# Patient Record
Sex: Male | Born: 1955 | Race: White | Hispanic: No | Marital: Married | Smoking: Never smoker
Health system: Southern US, Community
[De-identification: ages and names within clinical notes are randomized; demographics above are authoritative.]

## PROBLEM LIST (undated history)

## (undated) DIAGNOSIS — I10 Essential (primary) hypertension: Secondary | ICD-10-CM

## (undated) DIAGNOSIS — K219 Gastro-esophageal reflux disease without esophagitis: Secondary | ICD-10-CM

## (undated) DIAGNOSIS — E785 Hyperlipidemia, unspecified: Secondary | ICD-10-CM

## (undated) DIAGNOSIS — I6529 Occlusion and stenosis of unspecified carotid artery: Secondary | ICD-10-CM

## (undated) DIAGNOSIS — I2510 Atherosclerotic heart disease of native coronary artery without angina pectoris: Secondary | ICD-10-CM

## (undated) DIAGNOSIS — IMO0002 Reserved for concepts with insufficient information to code with codable children: Secondary | ICD-10-CM

## (undated) HISTORY — DX: Occlusion and stenosis of unspecified carotid artery: I65.29

## (undated) HISTORY — PX: CERVICAL SPINE SURGERY: SHX589

## (undated) HISTORY — PX: CHOLECYSTECTOMY: SHX55

## (undated) HISTORY — PX: CARDIAC CATHETERIZATION: SHX172

## (undated) HISTORY — DX: Hyperlipidemia, unspecified: E78.5

## (undated) HISTORY — PX: CORONARY ANGIOPLASTY WITH STENT PLACEMENT: SHX49

---

## 2012-04-20 ENCOUNTER — Encounter (HOSPITAL_COMMUNITY)
Admission: AD | Disposition: A | Discharge status: Home / Self Care | Payer: Self-pay | Source: Other Acute Inpatient Hospital | Attending: Cardiology

## 2012-04-20 ENCOUNTER — Encounter (HOSPITAL_COMMUNITY): Payer: Self-pay | Admitting: General Practice

## 2012-04-20 ENCOUNTER — Inpatient Hospital Stay (HOSPITAL_COMMUNITY)
Admission: AD | Admit: 2012-04-20 | Discharge: 2012-04-22 | DRG: 287 | Disposition: A | Discharge status: Home / Self Care | Payer: Managed Care, Other (non HMO) | Source: Other Acute Inpatient Hospital | Attending: Cardiology | Admitting: Cardiology

## 2012-04-20 DIAGNOSIS — Z7902 Long term (current) use of antithrombotics/antiplatelets: Secondary | ICD-10-CM

## 2012-04-20 DIAGNOSIS — R072 Precordial pain: Secondary | ICD-10-CM

## 2012-04-20 DIAGNOSIS — I2510 Atherosclerotic heart disease of native coronary artery without angina pectoris: Secondary | ICD-10-CM

## 2012-04-20 DIAGNOSIS — R1110 Vomiting, unspecified: Secondary | ICD-10-CM | POA: Diagnosis not present

## 2012-04-20 DIAGNOSIS — R51 Headache: Secondary | ICD-10-CM | POA: Diagnosis not present

## 2012-04-20 DIAGNOSIS — R079 Chest pain, unspecified: Secondary | ICD-10-CM

## 2012-04-20 DIAGNOSIS — R0789 Other chest pain: Principal | ICD-10-CM | POA: Diagnosis present

## 2012-04-20 DIAGNOSIS — Z7982 Long term (current) use of aspirin: Secondary | ICD-10-CM

## 2012-04-20 DIAGNOSIS — Z9861 Coronary angioplasty status: Secondary | ICD-10-CM

## 2012-04-20 DIAGNOSIS — I10 Essential (primary) hypertension: Secondary | ICD-10-CM | POA: Diagnosis present

## 2012-04-20 DIAGNOSIS — K219 Gastro-esophageal reflux disease without esophagitis: Secondary | ICD-10-CM | POA: Diagnosis present

## 2012-04-20 HISTORY — DX: Gastro-esophageal reflux disease without esophagitis: K21.9

## 2012-04-20 HISTORY — DX: Atherosclerotic heart disease of native coronary artery without angina pectoris: I25.10

## 2012-04-20 HISTORY — PX: LEFT HEART CATHETERIZATION WITH CORONARY ANGIOGRAM: SHX5451

## 2012-04-20 HISTORY — DX: Essential (primary) hypertension: I10

## 2012-04-20 HISTORY — DX: Reserved for concepts with insufficient information to code with codable children: IMO0002

## 2012-04-20 LAB — CARDIAC PANEL(CRET KIN+CKTOT+MB+TROPI)
CK, MB: 4.5 ng/mL — ABNORMAL HIGH (ref 0.3–4.0)
Relative Index: INVALID (ref 0.0–2.5)
Troponin I: <0.3 ng/mL (ref <0.30)

## 2012-04-20 SURGERY — LEFT HEART CATHETERIZATION WITH CORONARY ANGIOGRAM
Anesthesia: LOCAL

## 2012-04-20 MED ORDER — PANTOPRAZOLE SODIUM 40 MG PO TBEC
40.0000 mg | DELAYED_RELEASE_TABLET | Freq: Every day | ORAL | Status: DC
  Administered 2012-04-20 – 2012-04-22 (×3): 40 mg via ORAL
  Filled 2012-04-20 (×3): qty 1

## 2012-04-20 MED ORDER — ISOSORBIDE MONONITRATE ER 30 MG PO TB24
30.0000 mg | ORAL_TABLET | Freq: Every day | ORAL | Status: DC
  Administered 2012-04-21 – 2012-04-22 (×2): 30 mg via ORAL
  Filled 2012-04-20 (×3): qty 1

## 2012-04-20 MED ORDER — HEPARIN (PORCINE) IN NACL 100-0.45 UNIT/ML-% IJ SOLN
1000.0000 [IU]/h | INTRAMUSCULAR | Status: DC
  Filled 2012-04-20: qty 250

## 2012-04-20 MED ORDER — METOPROLOL SUCCINATE ER 25 MG PO TB24
25.0000 mg | ORAL_TABLET | Freq: Every day | ORAL | Status: DC
  Filled 2012-04-20: qty 1

## 2012-04-20 MED ORDER — ATORVASTATIN CALCIUM 40 MG PO TABS
40.0000 mg | ORAL_TABLET | Freq: Every day | ORAL | Status: DC
  Administered 2012-04-20 – 2012-04-22 (×3): 40 mg via ORAL
  Filled 2012-04-20 (×4): qty 1

## 2012-04-20 MED ORDER — NITROGLYCERIN IN D5W 200-5 MCG/ML-% IV SOLN: 5.0000 ug/min | INTRAVENOUS | Status: DC

## 2012-04-20 MED ORDER — NITROGLYCERIN 0.2 MG/ML ON CALL CATH LAB
INTRAVENOUS | Status: AC
  Filled 2012-04-20: qty 1

## 2012-04-20 MED ORDER — LIDOCAINE HCL (PF) 1 % IJ SOLN
INTRAMUSCULAR | Status: AC
  Filled 2012-04-20: qty 30

## 2012-04-20 MED ORDER — CLOPIDOGREL BISULFATE 75 MG PO TABS
75.0000 mg | ORAL_TABLET | Freq: Every day | ORAL | Status: DC
  Administered 2012-04-21 – 2012-04-22 (×2): 75 mg via ORAL
  Filled 2012-04-20 (×2): qty 1

## 2012-04-20 MED ORDER — ASPIRIN EC 81 MG PO TBEC
81.0000 mg | DELAYED_RELEASE_TABLET | Freq: Every day | ORAL | Status: DC
  Administered 2012-04-21 – 2012-04-22 (×2): 81 mg via ORAL
  Filled 2012-04-20 (×4): qty 1

## 2012-04-20 MED ORDER — HEPARIN SODIUM (PORCINE) 1000 UNIT/ML IJ SOLN
INTRAMUSCULAR | Status: AC
  Filled 2012-04-20: qty 1

## 2012-04-20 MED ORDER — HEPARIN (PORCINE) IN NACL 2-0.9 UNIT/ML-% IJ SOLN
INTRAMUSCULAR | Status: AC
  Filled 2012-04-20: qty 2000

## 2012-04-20 MED ORDER — MORPHINE SULFATE 2 MG/ML IJ SOLN
INTRAMUSCULAR | Status: AC
  Filled 2012-04-20: qty 1

## 2012-04-20 MED ORDER — MORPHINE SULFATE 2 MG/ML IJ SOLN
2.0000 mg | INTRAMUSCULAR | Status: AC
  Administered 2012-04-20: 2 mg via INTRAVENOUS

## 2012-04-20 MED ORDER — SODIUM CHLORIDE 0.9 % IV SOLN
INTRAVENOUS | Status: DC
  Administered 2012-04-20: 15:00:00 via INTRAVENOUS

## 2012-04-20 MED ORDER — VERAPAMIL HCL 2.5 MG/ML IV SOLN
INTRAVENOUS | Status: AC
  Filled 2012-04-20: qty 2

## 2012-04-20 MED ORDER — MORPHINE SULFATE 2 MG/ML IJ SOLN
2.0000 mg | INTRAMUSCULAR | Status: DC | PRN
  Administered 2012-04-21 – 2012-04-22 (×3): 2 mg via INTRAVENOUS
  Filled 2012-04-20 (×3): qty 1

## 2012-04-20 MED ORDER — BIVALIRUDIN 250 MG IV SOLR
INTRAVENOUS | Status: AC
  Filled 2012-04-20: qty 250

## 2012-04-20 MED ORDER — SODIUM CHLORIDE 0.9 % IV SOLN
INTRAVENOUS | Status: AC
  Administered 2012-04-20: 20:00:00 via INTRAVENOUS

## 2012-04-20 MED ORDER — ADENOSINE 12 MG/4ML IV SOLN
16.0000 mL | Freq: Once | INTRAVENOUS | Status: DC
  Filled 2012-04-20: qty 16

## 2012-04-20 MED ORDER — ACETAMINOPHEN 325 MG PO TABS
650.0000 mg | ORAL_TABLET | ORAL | Status: DC | PRN
  Administered 2012-04-21: 650 mg via ORAL
  Filled 2012-04-20: qty 2

## 2012-04-20 MED ORDER — LISINOPRIL 40 MG PO TABS
40.0000 mg | ORAL_TABLET | Freq: Every day | ORAL | Status: DC
  Filled 2012-04-20: qty 1

## 2012-04-20 MED ORDER — ONDANSETRON HCL 4 MG/2ML IJ SOLN
4.0000 mg | Freq: Four times a day (QID) | INTRAMUSCULAR | Status: DC | PRN
  Administered 2012-04-21: 4 mg via INTRAVENOUS
  Filled 2012-04-20: qty 2

## 2012-04-20 MED ORDER — CLOPIDOGREL BISULFATE 75 MG PO TABS: 75.0000 mg | ORAL_TABLET | Freq: Every day | ORAL | Status: DC

## 2012-04-20 MED ORDER — ASPIRIN EC 81 MG PO TBEC
81.0000 mg | DELAYED_RELEASE_TABLET | Freq: Every day | ORAL | Status: DC
  Filled 2012-04-20: qty 1

## 2012-04-20 MED ORDER — NITROGLYCERIN 0.4 MG SL SUBL
0.4000 mg | SUBLINGUAL_TABLET | SUBLINGUAL | Status: DC | PRN
  Administered 2012-04-21: 0.4 mg via SUBLINGUAL
  Filled 2012-04-20: qty 25

## 2012-04-20 MED FILL — Nitroglycerin IV Soln 200 MCG/ML in D5W: INTRAVENOUS | Qty: 250 | Status: AC

## 2012-04-20 MED FILL — Heparin Sodium (Porcine) 100 Unt/ML in Sodium Chloride 0.45%: INTRAMUSCULAR | Qty: 250 | Status: AC

## 2012-04-20 NOTE — CV Procedure (Addendum)
Cardiac Catheterization Operative Report  SINCLAIR ALLIGOOD [id number] 8/26/20134:26 PM Donnetta Hutching DAVID, MD  Procedure Performed:  1. Left Heart Catheterization 2. Selective Coronary Angiography 3. Left ventricular angiogram 4. Fractional flow reserve mid LAD  Operator: Verne Carrow, MD  Arterial access site:  Right radial artery.   Indication:  Chest pain in pt with known CAD with previous proximal to mid LAD stent. EKG without ischemic changes.  CK and troponin normal in setting of ongoing chest pain for the last 8 hours. Pt reportedly had a normal stress test several weeks ago.                                 Procedure Details: The risks, benefits, complications, treatment options, and expected outcomes were discussed with the patient. The patient and/or family concurred with the proposed plan, giving informed consent. The patient was brought to the cath lab after IV hydration was begun and oral premedication was given. The patient was further sedated with Versed and Fentanyl. The right wrist was assessed with an Allens test which was positive. The right wrist was prepped and draped in a sterile fashion. 1% lidocaine was used for local anesthesia. Using the modified Seldinger access technique, a 5 French sheath was placed in the right radial artery. 3 mg Verapamil was given through the sheath. 3000 units IV heparin was given. He had been on a heparin drip on the floor. Standard diagnostic catheters were used to perform selective coronary angiography. A pigtail catheter was used to perform a left ventricular angiogram. He was found to have a moderate stenosis in the mid LAD beyond the stented segment. This appeared to represent 50-60% stenosis angiographically. I elected to perform a Fractional Flow Reserve measurement with a flow wire. The sheath was upsized to a 6 [place] system. I then gave the pt a bolus of Angiomax and a drip was started. I engaged the left main with a XB  LAD 3.5 guiding catheter. When the ACT was greater than 200, I passed a pressure wire down the LAD, normalizing in the left main segment. Baseline FFR was 0.93 beyond the stenosis in the mid vessel. IV adenosine was given and after 2 minutes of infusion at peak hyperemia, the FFR was 0.84. The stenosis was not flow limiting based on this data. Final angiography demonstrated no disruption of the LAD after pressure wire analysis. The guide and wire were removed.   The sheath was removed from the right radial artery and a Terumo hemostasis band was applied at the arteriotomy site on the right wrist.  There were no immediate complications. The patient was taken to the recovery area in stable condition.   Hemodynamic Findings: Central aortic pressure: 117/76 Left ventricular pressure: 119/3/10  Angiographic Findings:  Left main: No obstructive disease noted.     Left Anterior Descending Artery: Moderate sized vessel that does not reach the apex. The mid vessel has a patent stent just beyond the trifurcation of a moderate sized diagonal branch and a moderate sized septal perforator. Just beyond the stent in the mid LAD, there is a 50-60% stenosis. The distal LAD has no obstructive disease. The diagonal is moderate sized and has mild plaque disease.   Circumflex Artery: Large caliber vessel with mild plaque in the proximal and distal vessel. The first obtuse marginal branch is moderate sized and appears to have a 20-30% stenosis at the ostium.   Right Coronary  Artery: Large, dominant vessel with diffuse luminal irregularities in the proximal, mod and distal segments. There appears to be a mild plaque at the ostium of the vessel but there was no dampening of pressure with catheter engagement and reflux of contrast with injections. The PDA and PLA are moderate sized vessels with luminal irregularities but no obstructive disease.   Aortic root angiogram: No evidence of aneurysm or dissection.   Left  Ventricular Angiogram: LVEF=70%.   Impression: 1. Single vessel CAD with patent stent in mid LAD and moderate disease beyond the stented segment. FFR in the mid LAD is 0.84, suggesting the stenosis is not flow limiting.  2. Mild non-obstructive disease in the Circumflex and RCA 3. Normal LV systolic function 4. No evidence of aortic dissection 5. Non-cardiac chest pain  Recommendations: Medical management. Will look for other causes of chest pain. D-dimer negative at [facility] so chance of PE is small.        Complications:  None. The patient tolerated the procedure well.

## 2012-04-20 NOTE — Progress Notes (Addendum)
ANTICOAGULATION CONSULT NOTE - Initial Consult  Pharmacy Consult for heparin Indication: chest pain/ACS and r/o PE  No Known Allergies  Patient Measurements: Height: 6' (182.9 cm) Weight: 189 lb (85.73 kg) IBW/kg (Calculated) : 77.6  Heparin Dosing Weight: 85.7 kg  Vital Signs: Temp: 97.4 F (36.3 C) (08/26 1230) Temp src: Oral (08/26 1230) BP: 106/54 mmHg (08/26 1245) Pulse Rate: 59  (08/26 1245)  Labs: No results found for this basename: HGB:2,HCT:3,PLT:3,APTT:3,LABPROT:3,INR:3,HEPARINUNFRC:3,CREATININE:3,CKTOTAL:3,CKMB:3,TROPONINI:3 in the last 72 hours  CrCl is unknown because no creatinine reading has been taken.   Medical History: No past medical history on file.  Medications:  Scheduled:    . aspirin EC  81 mg Oral Daily  . clopidogrel  75 mg Oral Q breakfast  . DISCONTD: aspirin EC  81 mg Oral Daily  . DISCONTD: clopidogrel  75 mg Oral Q breakfast   Infusions:    . sodium chloride    . nitroGLYCERIN      Assessment: 56 yo male with ACS and r/o PE was transferred from [facility]  While there, received 5000 units iv bolus of heparin and started on heparin drip at 1000 units/hr since ~ 1015.  At [facility], H/H was 15.3/46.2 and Plt 185.  Probably will go to cath at ~1700 today.  Goal of Therapy:  Heparin level 0.3-0.7 units/ml Monitor platelets by anticoagulation protocol: Yes   Plan:  1) Since patient has been on the heparin drip at this rate since ~1015, will continue heparin drip at 1000 units/hr for now.  Obtain a heparin level at 1615 and reassess dosing or f/u after cath. 2) If continue heparin after cath, will have daily heparin level and CBC  Ashanna Heinsohn, Tsz-Yin 04/20/2012,2:34 PM

## 2012-04-20 NOTE — Progress Notes (Signed)
Patient received from [facility], called to write admission orders. I discussed the patient with Dr. Andee Lineman - please see H&P. Mr. Diodato was admitted with [place] and is for cath today. He was given ASA 324mg  at [facility] and also took his Plavix and metoprolol this morning. Unfortunately his wife no longer has his medication list and [facility] does not have a copy. We will ask our pharmacy team to see if they can clarify his home medications - have written order for them to page [place] when this is completed so we can appropriately order his home doses. For now, I have tentatively written for his ASA/Plavix to be continued tomorrow AM.   Patient had 6/10 pain on arrival to [facility] that was eased with NTG gtt. However, despite uptitration to 21mcg/min he had continued discomfort to 4/10. 12 lead EKG showed no acute changes. Patient examined, appeared restless in bed. He got 10mg  of IV morphine around 9-10am at [facility] that eased pain but did not help completely. He took 2 SL NTG prior to arrival in [place]. Remains on heparin gtt. Most recent BP 132/84 manually. 2mg  of morphine ordered. Cath lab team notified to take patient to lab given ongoing discomfort.  Dayna Dunn PA-C

## 2012-04-20 NOTE — H&P (Signed)
[facility]                                             [place], [place]  [address]                       NAME:  Ricky Hughes, Ricky Hughes            ROOM:                                      UNIT NUMBER:  [id number]                        LOCATION:      ER               ADM/VISIT DATE:     04/20/12                ADM PHYS:      Sinclair Ship MD         ACCT: [id number]                               DOB:           12-Apr-2056               CARDIOLOGY CONSULTATION REPORT                                                             DATE OF CONSULTATION:  04/20/2012               REASON FOR CONSULTATION:  Substernal chest pain.               HISTORY OF PRESENT ILLNESS:  The patient is a 56 year old male with a prior       history of coronary artery disease per his history.  He had prior cardiac       catheterizations in [place] and, apparently, several years ago had a stent       placed.  The patient is taking aspirin and Plavix on a regular basis.  I do not       have any further details on the location of his stent.               The patient is here with his friend and boss.  The patient this morning states       that when he woke up he felt very tired and had significant chest discomfort.        He rated his pain a 9/10.  He felt a tightness, which was radiating to the left       shoulder and left arm, with tingling and numbness in the  fingers.  He felt       clammy and diaphoretic.  He was not in the mood for breakfast, but did drink a       little bit of coffee.  He also became nauseated.  He felt quite lightheaded,       but had no presyncope or syncope.  Although he was nauseated, he did not report       any vomiting.  According to his boss, the patient has been experiencing       substernal chest pressure both at rest and on exertion over the last several       weeks.  This actually led to a stress test (GXT), which,  according to the       patient was within normal limits.  However, when I questioned him about it, he       states that he could not go very far on the treadmill and had significant chest       discomfort and felt very diaphoretic.               Next, he has been compliant with his medical therapy, and this includes his       aspirin and Plavix.  He also takes statin and beta blocker.               On arrival in the emergency room, he had ongoing chest pain, but there were no       acute EKG changes.  I did a stat bedside echocardiogram, which revealed       essentially normal LV function, but right ventricular enlargement.  I also had       a very good look at the aortic root and the ascending aorta, which were within       normal limits, with no evidence for dissection.               The patient received a total of 10 mg of morphine, which brought his pain down       to a 6/10, and he is also on intravenous nitroglycerin.  However, he is very       agitated and restless and continues to complaint of chest pressure.  Of note is       also that on the echocardiogram there was some mild inferior hypokinesis, but       it is hard to tell whether this is an old finding or a new finding.               We are unable to make the patient comfortable in the emergency room, and we       also started intravenous heparin.  He did report taking his aspirin and Plavix       this morning.  He was very hypertensive on admission, but this has come down       now.                CARDIOLOGY CONSULTATION - Medical Records' copy                    Page 1 of 4  [facility]                                             [place], [place]  [address]                       NAME:  Ricky Hughes, Ricky Hughes            ROOM:                                      UNIT NUMBER:  [id number]                        LOCATION:      ER               ADM/VISIT DATE:      04/20/12                ADM PHYS:      Sinclair Ship MD         ACCT: [id number]                               DOB:           March 26, 2056               CARDIOLOGY CONSULTATION REPORT                                       ALLERGIES:  No known drug allergies.  No IVP dye allergy.               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  He lives in [place], but declines to go to the [place] emergency       room.               FAMILY HISTORY:  Noncontributory.               PAST MEDICAL HISTORY:  Negative for diabetes mellitus.  Positive for       hypertension.  Patient does not really report any significant prior surgical       history.               SOCIAL HISTORY:  The patient does not smoke.  He lives with his wife.  He       denies any drug use.               REVIEW OF SYSTEMS:  The patient reports pain in the left lower extremity, with       cramping, but not consistent with claudication.  He also has intermittent left       neck pain, which seems to be associated with his chest pain.  He reports       nausea, but no vomiting.  He has had no fever or chills.  Remainder of the       18-point review of systems is as outlined above and otherwise negative.  PHYSICAL EXAMINATION:  Vital signs:  Blood pressure 168/104, but has come down       to 123 systolic after 2 nitroglycerins, intravenous nitroglycerin, and       morphine.  Heart rate is 73 bpm, respirations are 18, temperature is 97.3,       saturation is 100% on room air.  General:  Well-nourished white male, very       uncomfortable.  Shuffling back and forth on the stretcher.  HEENT:  Pupils       isocoric, EOMI.  Oropharynx is clear.  Normal dentition.  Neck:  Normal carotid       upstroke and no carotid bruits.  JVP is 6-7 cm.  No thyromegaly.  No nodular       thyroid.  Lungs:  Clear breath sounds bilaterally.  No wheezing.  Heart:        Regular rate and rhythm  with normal S1, S2.  No murmur, rubs, or gallops.        Abdomen:  Soft, nontender, with no rebound or guarding and good bowel sounds.        Urogenital:  Deferred.  Extremities:  No cyanosis, clubbing, or edema.  Normal       distal pulses, normal femoral pulses bilaterally without any bruits.        Neurologic:  Alert and oriented and grossly nonfocal.  Psychiatric:  Anxious.               LABORATORY WORK:  CBC and BMET are all within normal limits.  PT INR is also       within normal limits.  Creatinine is 0.7.  First troponin is less than 0.01.               A 12-lead electrocardiogram:  Normal sinus rhythm with no acute ischemic       changes.               Chest x-ray is pending.               PROBLEM LIST:                CARDIOLOGY CONSULTATION - Medical Records' copy                    Page 2 of 4                                                       [facility]                                             [place], [place]  [address]                       NAME:  Ricky Hughes, Ricky Hughes            ROOM:                                      UNIT NUMBER:  [id number]  LOCATION:      ER               ADM/VISIT DATE:     04/20/12                ADM PHYS:      Sinclair Ship MD         ACCT: [id number]                               DOB:           Oct 02, 2055               CARDIOLOGY CONSULTATION REPORT                               1.   Rule out unstable angina.            a.   Negative 1st of troponin.            b.   Questionable mild inferior hypokinesis by bedside echocardiogram,                 with normal left ventricular systolic function.            c.   Status post prior stent placement 3 years ago, details unavailable.                  Done in [place] by Dr. Durel Salts and Dr. Georgena Spurling.       2.   Hypertension, poorly controlled.       3.   Rule out aortic dissection, unlikely.            a.   Normal bedside echocardiogram, with good visualization  of the aortic                 root and ascending aorta, which are within normal limits without                 dilatation.       4.   Rule out pulmonary embolism.               PLAN:       1.   The patient is very uncomfortable, even after 10 mg of morphine.  He keeps            complaining of significant substernal chest pain, despite the absence of            EKG changes.  He does have an area of inferior hypokinesis, by            echocardiogram, and given his prior history of coronary artery disease I            suspect he presents with unstable angina, until proven otherwise.       2.   The patient is being treated aggressively.  He has received aspirin and            Plavix, he has been started on intravenous heparin and also intravenous            nitroglycerin.  His pain has improved.  I also gave the patient Ativan, in            preparation for his cardiac catheterization.       3.   In the differential diagnosis of his condition  should be aortic            dissection.  It may not be unreasonable to do an aortogram during the            cardiac catheterization.  However, I did have a good look at his ascending            aorta and aortic root with a bedside echocardiogram.  This appeared to be            within normal limits.  If the catheterization is negative, given the            patient's restlessness, I would definitely rule him out with a D-dimer and            a CT scan for possibly pulmonary embolism.  As outlined above, he has been            placed on heparin.       4.   I discussed with the patient the risks and benefits of a cardiac            catheterization, and he has agreed to proceed.  I gave him the option            between going to [place] or going to [facility], and given the            fact that I work for [facility], the patient was fine to go            there, but according to his wife they would like to be followed by their             cardiologists in [place].  Again, this is Dr. Durel Salts and Dr. Georgena Spurling,            and their phone number is [phone].                                    CARDIOLOGY CONSULTATION - Medical Records' copy                    Page 3 of 4                                                       [facility]                                             [place], [place]  [address]                       NAME:  Ricky Hughes, Ricky Hughes            ROOM:                                      UNIT NUMBER:  [id number]                        LOCATION:      ER  ADM/VISIT DATE:     04/20/12                ADM PHYS:      Sinclair Ship MD         ACCT: [id number]                               DOB:           2055-10-01               CARDIOLOGY CONSULTATION REPORT                               Note:  Copy to Dr. Collier Bullock and Dr. Marda Stalker in [place],       office number [phone].                               DICTATED NOT READ        (unless electronically signed)                                                            ______________________________                                                            Learta Codding, MD                                           [facility]                     D:  04/20/12 1034       T:  04/20/12 1100       P:  DEG              UI: [phone]       copy for: Medical Records                      cc:  SEE NOTE 1            SEE NOTE 2                                                                                                   CARDIOLOGY CONSULTATION - Medical Records' copy                    Page 4 of 4

## 2012-04-21 ENCOUNTER — Encounter (HOSPITAL_COMMUNITY): Payer: Self-pay | Admitting: Cardiology

## 2012-04-21 DIAGNOSIS — R072 Precordial pain: Secondary | ICD-10-CM

## 2012-04-21 DIAGNOSIS — R079 Chest pain, unspecified: Secondary | ICD-10-CM

## 2012-04-21 DIAGNOSIS — I2510 Atherosclerotic heart disease of native coronary artery without angina pectoris: Secondary | ICD-10-CM

## 2012-04-21 LAB — CBC
MCH: 29.6 pg (ref 26.0–34.0)
MCV: 86.6 fL (ref 78.0–100.0)
Platelets: 181 10*3/uL (ref 150–400)
RBC: 4.63 MIL/uL (ref 4.22–5.81)

## 2012-04-21 LAB — BASIC METABOLIC PANEL
BUN: 14 mg/dL (ref 6–23)
CO2: 28 mEq/L (ref 19–32)
Calcium: 9.5 mg/dL (ref 8.4–10.5)
Creatinine, Ser: 0.7 mg/dL (ref 0.50–1.35)
Glucose, Bld: 105 mg/dL — ABNORMAL HIGH (ref 70–99)

## 2012-04-21 LAB — CARDIAC PANEL(CRET KIN+CKTOT+MB+TROPI)
CK, MB: 3.4 ng/mL (ref 0.3–4.0)
Relative Index: INVALID (ref 0.0–2.5)
Troponin I: <0.3 ng/mL (ref <0.30)

## 2012-04-21 MED ORDER — BUTALBITAL-APAP-CAFFEINE 50-325-40 MG PO TABS
2.0000 | ORAL_TABLET | Freq: Three times a day (TID) | ORAL | Status: DC | PRN
Start: 2012-04-21 — End: 2012-04-22
  Administered 2012-04-21 – 2012-04-22 (×2): 2 via ORAL
  Filled 2012-04-21 (×3): qty 2

## 2012-04-21 MED ORDER — LISINOPRIL 40 MG PO TABS
40.0000 mg | ORAL_TABLET | Freq: Every day | ORAL | Status: DC
  Administered 2012-04-21 – 2012-04-22 (×2): 40 mg via ORAL
  Filled 2012-04-21 (×2): qty 1

## 2012-04-21 MED ORDER — BUTALBITAL-APAP-CAFFEINE 50-325-40 MG PO TABS
2.0000 | ORAL_TABLET | Freq: Once | ORAL | Status: AC
  Administered 2012-04-21: 2 via ORAL
  Filled 2012-04-21: qty 2

## 2012-04-21 MED ORDER — ALUM & MAG HYDROXIDE-SIMETH 200-200-20 MG/5ML PO SUSP
30.0000 mL | ORAL | Status: DC | PRN
  Administered 2012-04-21: 02:00:00 30 mL via ORAL
  Filled 2012-04-21: qty 30

## 2012-04-21 MED ORDER — ASPIRIN 81 MG PO TABS: 81.0000 mg | ORAL_TABLET | Freq: Every day | ORAL | Status: AC

## 2012-04-21 MED ORDER — METOPROLOL SUCCINATE ER 25 MG PO TB24
25.0000 mg | ORAL_TABLET | Freq: Every day | ORAL | Status: DC
  Administered 2012-04-21 – 2012-04-22 (×2): 25 mg via ORAL
  Filled 2012-04-21 (×2): qty 1

## 2012-04-21 MED FILL — Dextrose Inj 5%: INTRAVENOUS | Qty: 50 | Status: AC

## 2012-04-21 NOTE — Progress Notes (Signed)
    Subjective:  Minimal residual chest soreness. Feels much better. No dyspnea or other complaints.  Objective:  Vital Signs in the last 24 hours: Temp:  [97.4 F (36.3 C)-97.8 F (36.6 C)] 97.4 F (36.3 C) (08/27 0820) Pulse Rate:  [53-78] 78  (08/27 0820) Resp:  [7-21] 21  (08/27 0820) BP: (95-157)/(54-94) 157/91 mmHg (08/27 0820) SpO2:  [96 %-100 %] 98 % (08/27 0820) Weight:  [189 lb (85.73 kg)-189 lb 13.1 oz (86.1 kg)] 189 lb 13.1 oz (86.1 kg) (08/27 0407)  Intake/Output from previous day: 08/26 0701 - 08/27 0700 In: 652.5 [P.O.:240; I.V.:412.5] Out: 1250 [Urine:1250]  Physical Exam: Pt is alert and oriented, NAD HEENT: normal Neck: JVP - normal Lungs: CTA bilaterally CV: RRR without murmur or gallop Abd: soft, NT, Positive BS, no hepatomegaly Ext: no C/C/E, distal pulses intact and equal, right radial site clear Skin: warm/dry no rash   Lab Results:  Basename 04/21/12 0215  WBC 7.4  HGB 13.7  PLT 181    Basename 04/21/12 0215  NA 139  K 3.7  CL 103  CO2 28  GLUCOSE 105*  BUN 14  CREATININE 0.70    Basename 04/21/12 0215 04/20/12 1944  TROPONINI <0.30 <0.30  Assessment/Plan:   1. Chest pain - suspect noncardiac. FFR and cath results reviewed with patient. Continue med management. Recommend outpatient GI eval with Dr Karilyn Cota in [place]. Discussed case with Dr Andee Lineman.  2. HTN - controlled. Continue current meds.  3. Dispo - home today.  Tonny Bollman, M.D. 04/21/2012, 8:54 AM

## 2012-04-21 NOTE — Progress Notes (Signed)
Attempted to discharge Ricky Hughes today, however, he has complained of severe headache on and off throughout the day. Each time it was initially relieved with morphine or Fioricet and he fell asleep, but when he woke up complained of headache again. He takes Advertising account executive at home for headaches. No focal neuro deficits. Discussed with Dr. Dietrich Pates who recommended he stay over night and have a neurology evaluation in the morning. Will order PRN Fioricet.   [place], PA-C 04/21/2012 8:15 PM  [phone] pgr

## 2012-04-21 NOTE — Progress Notes (Signed)
Patient c/o chest pressure Ekg done no changes morphine 2mg  and Maalox given. SB 50's B/P 141/86 I will continue to monitor.

## 2012-04-21 NOTE — Progress Notes (Signed)
Notified MD on call patient c/o chest pressure radiating under left arm 3/10 previous episodes I medicated and did ekg. Patient c/o again this time SL nitro given after MD notified and agrees to try for chest pressure. Vitals stable SB 50's. I will continue to monitor patient.

## 2012-04-21 NOTE — Progress Notes (Signed)
Pt  Still complaining of headache 5/10 curled in bed, no n/v at this time. [facility] PA notified, claimed will notify attending .Pt"s wife at bedside, emotional support and comfort provided.

## 2012-04-21 NOTE — Progress Notes (Signed)
Pt woke up still complaining of severe headache 10/10 ,with some dizziness , vomitted large amt notified , b/p 126/72, denies chest pain , Rhonda barrett notified with med ordered. 2 tabs fioricet given.

## 2012-04-21 NOTE — Discharge Summary (Signed)
Agree. See progress note this same date.  Ricky Hughes 04/21/2012 10:54 PM

## 2012-04-21 NOTE — Discharge Summary (Addendum)
Discharge Summary   Patient ID: Ricky Hughes MRN: [id number], DOB/AGE: 1956/07/11 56 y.o.  Primary MD: Maximiano Coss, MD Primary Cardiologist: Dr. Durel Salts and Dr. Georgena Spurling in [place] Admit date: 04/20/2012 D/C date:     04/22/2012      Primary Discharge Diagnoses:  1. Chest pain, Noncardiac  - No objective evidence of cardiac ischemia, DDimer normal  - Cath revealed single vessel CAD w/ patent stent in mid LAD & mod non flow limiting disease beyond the stented segment, mild non-obstructive dz in LCx and RCA, nl LV systolic fxn, no evidence of aortic dissection  - Recommend outpatient GI evaluation with Dr. Karilyn Cota in [place]  2. Headache  - No focal neuro deficits  - Improved with Fioricet  - F/u with PCP  Secondary Discharge Diagnoses:  1. Coronary Artery Disease s/p PCI to LAD 2010 2. Hypertension 3. GERD   Allergies No Known Allergies  Diagnostic Studies/Procedures:   04/20/12 - Cardiac Cath Hemodynamic Findings:  Central aortic pressure: 117/76  Left ventricular pressure: 119/3/10  Angiographic Findings:  Left main: No obstructive disease noted.  Left Anterior Descending Artery: Moderate sized vessel that does not reach the apex. The mid vessel has a patent stent just beyond the trifurcation of a moderate sized diagonal branch and a moderate sized septal perforator. Just beyond the stent in the mid LAD, there is a 50-60% stenosis. The distal LAD has no obstructive disease. The diagonal is moderate sized and has mild plaque disease.  Circumflex Artery: Large caliber vessel with mild plaque in the proximal and distal vessel. The first obtuse marginal branch is moderate sized and appears to have a 20-30% stenosis at the ostium.  Right Coronary Artery: Large, dominant vessel with diffuse luminal irregularities in the proximal, mod and distal segments. There appears to be a mild plaque at the ostium of the vessel but there was no dampening of pressure with  catheter engagement and reflux of contrast with injections. The PDA and PLA are moderate sized vessels with luminal irregularities but no obstructive disease.  Aortic root angiogram: No evidence of aneurysm or dissection.  Left Ventricular Angiogram: LVEF=70%.  Impression:  1. Single vessel CAD with patent stent in mid LAD and moderate disease beyond the stented segment. FFR in the mid LAD is 0.84, suggesting the stenosis is not flow limiting.  2. Mild non-obstructive disease in the Circumflex and RCA  3. Normal LV systolic function  4. No evidence of aortic dissection  5. Non-cardiac chest pain  Recommendations: Medical management. Will look for other causes of chest pain. D-dimer negative at [facility] so chance of PE is small.   History of Present Illness: 56 y.o. male w/ the above medical problems who transferred from [facility] to [facility] on 04/20/12 with complaints of chest pain. On day of presentation he woke up with chest tightness radiating to his left shoulder/arm prompting him to seek medical evaluation.  Hospital Course: At [facility], EKG revealed NSR with no acute ST/T changes. CXR was without acute cardiopulmonary abnormalities. Labs were significant for normal troponin and unremarkable CBC/BMET. He had continued pain in the ED despite IV morphine and NTG. Bedside echo was completed revealing normal ascending aorta and aortic root as well as normal LV function.  He was placed on Heparin and NTG drips and transferred to [facility] for further evaluation and treatment.   Cath revealed single vessel CAD w/ patent stent in mid LAD & mod non flow limiting disease beyond the  stented segment, mild non-obstructive dz in LCx and RCA, nl LV systolic fxn, and no evidence of aortic dissection. He tolerated the procedure well without complications. Recommendations were made for continued medical management and outpatient GI evaluation to look for other causes of chest  pain. Cardiac enzymes were cycled and remained negative. He was able to ambulate without chest pain or sob. Cath site remained stable. He was seen and evaluated by Dr. Excell Seltzer who felt he was stable for discharge home with plans for follow up as scheduled below.  Addendum: On day of discharge the patient complained of headache and vomiting not relieved with Tylenol, ASA, caffeine, or morphine. He had no neuro deficits and vital signs remained stable. He did not feel comfortable going home until his headache resolved. Fioricet was given with good relief. He was monitored overnight and feels well this morning and ready to go home. He is being discharged in stable condition with instructions to follow up with his PCP.  Discharge Vitals: Blood pressure 130/78, pulse 62, temperature 98.1 F (36.7 C), temperature source Oral, resp. rate 15, height 6' (1.829 m), weight 189 lb 13.1 oz (86.1 kg), SpO2 96.00%.  Labs: Component Value Date   WBC 7.4 04/21/2012   HGB 13.7 04/21/2012   HCT 40.1 04/21/2012   MCV 86.6 04/21/2012   PLT 181 04/21/2012    Lab 04/21/12 0215  NA 139  K 3.7  CL 103  CO2 28  BUN 14  CREATININE 0.70  CALCIUM 9.5  GLUCOSE 105*   Basename 04/21/12 0215 04/20/12 1944 04/20/12 1426  CKTOTAL 81 87 101  CKMB 3.4 4.0 4.5*  TROPONINI <0.30 <0.30 <0.30   Component Value Date   DDIMER 0.27 04/20/2012    Discharge Medications   Medication List  As of 04/22/2012  8:13 AM   STOP taking these medications         GOODY HEADACHE PO      heparin 5000 UNIT/ML injection      LORazepam 2 MG/ML injection         TAKE these medications         aspirin 81 MG tablet   Take 1 tablet (81 mg total) by mouth daily.      atorvastatin 40 MG tablet   Commonly known as: LIPITOR   Take 40 mg by mouth daily.      butalbital-acetaminophen-caffeine 50-325-40 MG per tablet   Commonly known as: FIORICET, ESGIC   Take 2 tablets by mouth every 8 (eight) hours as needed for headache.       clopidogrel 75 MG tablet   Commonly known as: PLAVIX   Take 75 mg by mouth daily.      lisinopril 40 MG tablet   Commonly known as: PRINIVIL,ZESTRIL   Take 40 mg by mouth daily.      metoprolol succinate 25 MG 24 hr tablet   Commonly known as: TOPROL-XL   Take 25 mg by mouth daily.      nitroGLYCERIN 0.4 MG SL tablet   Commonly known as: NITROSTAT   Place 0.4 mg under the tongue every 5 (five) minutes as needed. For chest pain.      omeprazole 20 MG capsule   Commonly known as: PRILOSEC   Take 20 mg by mouth daily.            Disposition   Discharge Orders    Future Orders Please Complete By Expires   Diet - low sodium heart healthy  Increase activity slowly      Discharge instructions      Comments:   * KEEP WRIST CATHETERIZATION SITE CLEAN AND DRY. Call the office for any signs of bleeding, pus, swelling, increased pain, or any other concerns. * NO HEAVY LIFTING (>10lbs) OR SEXUAL ACTIVITY X 6 DAYS. * NO DRIVING X 2 DAYS. * NO SOAKING BATHS, HOT TUBS, POOLS, ETC., X 6 DAYS.  * There were no findings during this hospitalization to suggest your chest pain is coming from your heart. Please follow up with your primary care provider and cardiologist. Recommendations are made for a GI evaluation  * Please follow up with your PCP regarding your headaches.     Follow-up Information    Follow up with Maximiano Coss, MD. (As needed)    Contact information:   [address] [place] [phone]       Follow up with Your Primary Cardiologist. (As needed)           Outstanding Labs/Studies:  None  Duration of Discharge Encounter: Greater than 30 minutes including physician and PA time.  Signed, Erynne Kealey PA-C 04/22/2012, 8:13 AM

## 2012-04-21 NOTE — Progress Notes (Signed)
C/o severe headache 10/10, with some nausea, b/p 132 76, denies chest pain. Had tylenol  2 tabs  Initially for  headachet but no relief. Notified [facility] . Ordered to give morphine 2mg   iv at this time.monitored pt .

## 2012-04-22 DIAGNOSIS — R51 Headache: Secondary | ICD-10-CM

## 2012-04-22 MED ORDER — BUTALBITAL-APAP-CAFFEINE 50-325-40 MG PO TABS: 2.0000 | ORAL_TABLET | Freq: Three times a day (TID) | ORAL | Status: AC | PRN

## 2012-04-22 NOTE — Progress Notes (Signed)
I discussed this patient's case with [facility]. I evaluated him yesterday and felt that he was stable for discharge, but his discharge was delayed because of headache. Says now essentially resolved and he has had chronic headaches. He was discharged home in stable condition to followup with his primary care physician.

## 2012-04-22 NOTE — Progress Notes (Signed)
Patient was seen and evaluated this morning. He is resting comfortably in bed and reports he slept well last night. Feels much better this morning with only a mild headache. On physical exam he has no acute findings, no neuro deficits. Vital signs are stable. Discussed with Dr. Excell Seltzer. Given that he has chronic headaches and is feeling much better today without acute findings on exam, he will be discharged home with instructions to follow up with his PCP. The patient is agreeable.   [place], PA-C 04/22/2012 8:07 AM  [phone] pgr

## 2014-08-04 ENCOUNTER — Encounter (HOSPITAL_COMMUNITY): Payer: Self-pay | Admitting: Cardiovascular Disease

## 2016-10-25 ENCOUNTER — Encounter (HOSPITAL_COMMUNITY): Payer: Self-pay

## 2016-10-25 ENCOUNTER — Inpatient Hospital Stay (HOSPITAL_COMMUNITY)
Admission: EM | Admit: 2016-10-25 | Discharge: 2016-11-05 | DRG: 234 | Disposition: A | Discharge status: Home / Self Care | Payer: Managed Care, Other (non HMO) | Attending: Surgery | Admitting: Surgery

## 2016-10-25 ENCOUNTER — Emergency Department (HOSPITAL_COMMUNITY): Payer: Managed Care, Other (non HMO)

## 2016-10-25 DIAGNOSIS — R9439 Abnormal result of other cardiovascular function study: Secondary | ICD-10-CM

## 2016-10-25 DIAGNOSIS — I25110 Atherosclerotic heart disease of native coronary artery with unstable angina pectoris: Secondary | ICD-10-CM | POA: Diagnosis not present

## 2016-10-25 DIAGNOSIS — I200 Unstable angina: Secondary | ICD-10-CM

## 2016-10-25 DIAGNOSIS — Z8249 Family history of ischemic heart disease and other diseases of the circulatory system: Secondary | ICD-10-CM

## 2016-10-25 DIAGNOSIS — Z7902 Long term (current) use of antithrombotics/antiplatelets: Secondary | ICD-10-CM

## 2016-10-25 DIAGNOSIS — E785 Hyperlipidemia, unspecified: Secondary | ICD-10-CM | POA: Diagnosis not present

## 2016-10-25 DIAGNOSIS — I340 Nonrheumatic mitral (valve) insufficiency: Secondary | ICD-10-CM | POA: Diagnosis present

## 2016-10-25 DIAGNOSIS — J9811 Atelectasis: Secondary | ICD-10-CM

## 2016-10-25 DIAGNOSIS — I10 Essential (primary) hypertension: Secondary | ICD-10-CM | POA: Diagnosis present

## 2016-10-25 DIAGNOSIS — Z955 Presence of coronary angioplasty implant and graft: Secondary | ICD-10-CM

## 2016-10-25 DIAGNOSIS — D696 Thrombocytopenia, unspecified: Secondary | ICD-10-CM | POA: Diagnosis not present

## 2016-10-25 DIAGNOSIS — I25119 Atherosclerotic heart disease of native coronary artery with unspecified angina pectoris: Secondary | ICD-10-CM | POA: Diagnosis not present

## 2016-10-25 DIAGNOSIS — R072 Precordial pain: Secondary | ICD-10-CM | POA: Diagnosis not present

## 2016-10-25 DIAGNOSIS — Z79899 Other long term (current) drug therapy: Secondary | ICD-10-CM

## 2016-10-25 DIAGNOSIS — E8770 Fluid overload, unspecified: Secondary | ICD-10-CM | POA: Diagnosis not present

## 2016-10-25 DIAGNOSIS — R079 Chest pain, unspecified: Secondary | ICD-10-CM

## 2016-10-25 DIAGNOSIS — I2510 Atherosclerotic heart disease of native coronary artery without angina pectoris: Secondary | ICD-10-CM

## 2016-10-25 DIAGNOSIS — Z8711 Personal history of peptic ulcer disease: Secondary | ICD-10-CM | POA: Diagnosis not present

## 2016-10-25 DIAGNOSIS — K5900 Constipation, unspecified: Secondary | ICD-10-CM | POA: Diagnosis not present

## 2016-10-25 DIAGNOSIS — Z7982 Long term (current) use of aspirin: Secondary | ICD-10-CM

## 2016-10-25 DIAGNOSIS — K219 Gastro-esophageal reflux disease without esophagitis: Secondary | ICD-10-CM | POA: Diagnosis present

## 2016-10-25 DIAGNOSIS — I252 Old myocardial infarction: Secondary | ICD-10-CM

## 2016-10-25 DIAGNOSIS — Z951 Presence of aortocoronary bypass graft: Secondary | ICD-10-CM

## 2016-10-25 LAB — BASIC METABOLIC PANEL
ANION GAP: 11 (ref 5–15)
BUN: 14 mg/dL (ref 6–20)
CHLORIDE: 103 mmol/L (ref 101–111)
CO2: 24 mmol/L (ref 22–32)
Calcium: 9.5 mg/dL (ref 8.9–10.3)
Creatinine, Ser: 0.79 mg/dL (ref 0.61–1.24)
GFR calc Af Amer: >60 mL/min (ref >60)
GFR calc non Af Amer: >60 mL/min (ref >60)
GLUCOSE: 128 mg/dL — AB (ref 65–99)
Potassium: 3.5 mmol/L (ref 3.5–5.1)
Sodium: 138 mmol/L (ref 135–145)

## 2016-10-25 LAB — HEPATIC FUNCTION PANEL
ALBUMIN: 4.3 g/dL (ref 3.5–5.0)
ALT: 20 U/L (ref 17–63)
AST: 25 U/L (ref 15–41)
Alkaline Phosphatase: 54 U/L (ref 38–126)
Bilirubin, Direct: 0.2 mg/dL (ref 0.1–0.5)
Indirect Bilirubin: 1.2 mg/dL — ABNORMAL HIGH (ref 0.3–0.9)
Total Bilirubin: 1.4 mg/dL — ABNORMAL HIGH (ref 0.3–1.2)
Total Protein: 7 g/dL (ref 6.5–8.1)

## 2016-10-25 LAB — CBC
HEMATOCRIT: 43.8 % (ref 39.0–52.0)
HEMOGLOBIN: 15.2 g/dL (ref 13.0–17.0)
MCH: 30.2 pg (ref 26.0–34.0)
MCHC: 34.7 g/dL (ref 30.0–36.0)
MCV: 86.9 fL (ref 78.0–100.0)
Platelets: 177 10*3/uL (ref 150–400)
RBC: 5.04 MIL/uL (ref 4.22–5.81)
RDW: 13.8 % (ref 11.5–15.5)
WBC: 8 10*3/uL (ref 4.0–10.5)

## 2016-10-25 LAB — I-STAT TROPONIN, ED: Troponin i, poc: 0 ng/mL (ref 0.00–0.08)

## 2016-10-25 LAB — GAMMA GT: GGT: 20 U/L (ref 7–50)

## 2016-10-25 LAB — PROTIME-INR
INR: 1.07
PROTHROMBIN TIME: 13.9 s (ref 11.4–15.2)

## 2016-10-25 LAB — TROPONIN I: Troponin I: <0.03 ng/mL (ref <0.03)

## 2016-10-25 MED ORDER — ASPIRIN 81 MG PO CHEW
324.0000 mg | CHEWABLE_TABLET | Freq: Once | ORAL | Status: AC
  Administered 2016-10-25: 324 mg via ORAL
  Filled 2016-10-25: qty 4

## 2016-10-25 MED ORDER — ONDANSETRON 4 MG PO TBDP
ORAL_TABLET | ORAL | Status: AC
  Administered 2016-10-25: 4 mg
  Filled 2016-10-25: qty 1

## 2016-10-25 MED ORDER — ONDANSETRON HCL 4 MG/2ML IJ SOLN: 4.0000 mg | Freq: Four times a day (QID) | INTRAMUSCULAR | Status: DC | PRN

## 2016-10-25 MED ORDER — NITROGLYCERIN 0.4 MG/SPRAY TL SOLN
1.0000 | Status: DC | PRN
  Administered 2016-10-25 (×2): 1 via SUBLINGUAL
  Filled 2016-10-25: qty 4.9

## 2016-10-25 MED ORDER — ASPIRIN EC 81 MG PO TBEC
81.0000 mg | DELAYED_RELEASE_TABLET | Freq: Every day | ORAL | Status: DC
Start: 2016-10-26 — End: 2016-11-01
  Administered 2016-10-26 – 2016-10-31 (×6): 81 mg via ORAL
  Filled 2016-10-25 (×6): qty 1

## 2016-10-25 MED ORDER — ENOXAPARIN SODIUM 40 MG/0.4ML ~~LOC~~ SOLN
40.0000 mg | SUBCUTANEOUS | Status: DC
  Administered 2016-10-25 – 2016-10-26 (×2): 40 mg via SUBCUTANEOUS
  Filled 2016-10-25 (×2): qty 0.4

## 2016-10-25 MED ORDER — ASPIRIN 81 MG PO TABS: 81.0000 mg | ORAL_TABLET | Freq: Every day | ORAL | Status: DC

## 2016-10-25 MED ORDER — MORPHINE SULFATE (PF) 4 MG/ML IV SOLN
1.0000 mg | INTRAVENOUS | Status: DC | PRN
Start: 2016-10-25 — End: 2016-11-01
  Administered 2016-10-26 – 2016-10-30 (×4): 2 mg via INTRAVENOUS
  Filled 2016-10-25 (×4): qty 1

## 2016-10-25 MED ORDER — CLOPIDOGREL BISULFATE 75 MG PO TABS
75.0000 mg | ORAL_TABLET | Freq: Every day | ORAL | Status: DC
  Administered 2016-10-26 – 2016-10-27 (×2): 75 mg via ORAL
  Filled 2016-10-25 (×2): qty 1

## 2016-10-25 MED ORDER — PANTOPRAZOLE SODIUM 40 MG PO TBEC
40.0000 mg | DELAYED_RELEASE_TABLET | Freq: Every day | ORAL | Status: DC
  Administered 2016-10-26 – 2016-10-31 (×6): 40 mg via ORAL
  Filled 2016-10-25 (×6): qty 1

## 2016-10-25 MED ORDER — GI COCKTAIL ~~LOC~~
30.0000 mL | Freq: Three times a day (TID) | ORAL | Status: DC | PRN
  Administered 2016-10-29: 30 mL via ORAL
  Filled 2016-10-25 (×2): qty 30

## 2016-10-25 MED ORDER — ATORVASTATIN CALCIUM 40 MG PO TABS
40.0000 mg | ORAL_TABLET | Freq: Every day | ORAL | Status: DC
  Administered 2016-10-27 – 2016-11-05 (×9): 40 mg via ORAL
  Filled 2016-10-25 (×9): qty 1

## 2016-10-25 MED ORDER — FAMOTIDINE IN NACL 20-0.9 MG/50ML-% IV SOLN
20.0000 mg | Freq: Two times a day (BID) | INTRAVENOUS | Status: DC
  Administered 2016-10-25 – 2016-10-29 (×6): 20 mg via INTRAVENOUS
  Filled 2016-10-25 (×8): qty 50

## 2016-10-25 MED ORDER — LISINOPRIL 20 MG PO TABS
40.0000 mg | ORAL_TABLET | Freq: Every day | ORAL | Status: DC
  Administered 2016-10-26 – 2016-10-31 (×6): 40 mg via ORAL
  Filled 2016-10-25 (×6): qty 1

## 2016-10-25 MED ORDER — ACETAMINOPHEN 325 MG PO TABS
650.0000 mg | ORAL_TABLET | ORAL | Status: DC | PRN
  Administered 2016-10-26 – 2016-10-31 (×4): 650 mg via ORAL
  Filled 2016-10-25 (×4): qty 2

## 2016-10-25 MED ORDER — METOPROLOL SUCCINATE ER 25 MG PO TB24
25.0000 mg | ORAL_TABLET | Freq: Every day | ORAL | Status: DC
  Administered 2016-10-26 – 2016-10-31 (×6): 25 mg via ORAL
  Filled 2016-10-25 (×6): qty 1

## 2016-10-25 MED ORDER — FENTANYL CITRATE (PF) 100 MCG/2ML IJ SOLN
50.0000 ug | Freq: Once | INTRAMUSCULAR | Status: AC
Start: 2016-10-25 — End: 2016-10-25
  Administered 2016-10-25: 50 ug via INTRAVENOUS
  Filled 2016-10-25: qty 2

## 2016-10-25 NOTE — ED Provider Notes (Signed)
[facility]-EMERGENCY DEPT Provider Note   CSN: [id number] Arrival date & time: 10/25/16  1613     History   Chief Complaint Chief Complaint  Patient presents with  . Nausea  . Chest Pain    HPI Ricky Hughes is a 61 y.o. male.  HPI  61 y.o. male with a hx of CAD, with Cath done 2013, presents to the Emergency Department today complaining of nausea, emesis and left shoulder pain PTA. Notes worsening throughout the day. States shortness of breath as well as dyspnea with exertion. Endorses mild left sided chest discomfort. No ABD pain. No URI symptoms. No musculoskeletal pain. States pain in shoulder is deep down. Rates pain 10/10. Took nitro before coming into ED with minimal relief. No other symptoms noted  Cath Report 04-20-12 Impression: 1. Single vessel CAD with patent stent in mid LAD and moderate disease beyond the stented segment. FFR in the mid LAD is 0.84, suggesting the stenosis is not flow limiting.  2. Mild non-obstructive disease in the Circumflex and RCA 3. Normal LV systolic function 4. No evidence of aortic dissection 5. Non-cardiac chest pain  Past Medical History:  Diagnosis Date  . Coronary artery disease    s/p PCI to LAD 2010; Cath 04/20/12  revealed single vessel CAD w/ patent stent in mid LAD & mod non flow limiting disease beyond the stented segment, mild non-obstructive dz in LCx and RCA, nl LV systolic fxn, no evidence of aortic dissection  . GERD (gastroesophageal reflux disease)   . Headache(784.0)   . Hypertension   . Ulcer [facility](HCC)     Patient Active Problem List   Diagnosis Date Noted  . Precordial pain 04/21/2012  . Coronary atherosclerosis of native coronary artery 04/21/2012    Past Surgical History:  Procedure Laterality Date  . CARDIAC CATHETERIZATION    . CHOLECYSTECTOMY    . CORONARY ANGIOPLASTY WITH STENT PLACEMENT    . LEFT HEART CATHETERIZATION WITH CORONARY ANGIOGRAM N/A 04/20/2012   Procedure: LEFT HEART CATHETERIZATION WITH CORONARY  ANGIOGRAM;  Surgeon: Kathleene Hazelhristopher D McAlhany, MD;  [facility];  Service: Cardiovascular;  Laterality: N/A;       Home Medications    Prior to Admission medications   Medication Sig Start Date End Date Taking? Authorizing Provider  aspirin 81 MG tablet Take 1 tablet (81 mg total) by mouth daily. 04/21/12   Jessica A Hope, PA-C  atorvastatin (LIPITOR) 40 MG tablet Take 40 mg by mouth daily.    Historical Provider, MD  clopidogrel (PLAVIX) 75 MG tablet Take 75 mg by mouth daily.    Historical Provider, MD  lisinopril (PRINIVIL,ZESTRIL) 40 MG tablet Take 40 mg by mouth daily.    Historical Provider, MD  metoprolol succinate (TOPROL-XL) 25 MG 24 hr tablet Take 25 mg by mouth daily.    Historical Provider, MD  nitroGLYCERIN (NITROSTAT) 0.4 MG SL tablet Place 0.4 mg under the tongue every 5 (five) minutes as needed. For chest pain.    Historical Provider, MD  omeprazole (PRILOSEC) 20 MG capsule Take 20 mg by mouth daily.    Historical Provider, MD    Family History No family history on file.  Social History Social History  Substance Use Topics  . Smoking status: Never Smoker  . Smokeless tobacco: Never Used  . Alcohol use Yes     Comment: rare     Allergies   Patient has no known allergies.   Review of Systems Review of Systems ROS reviewed and all are  negative for acute change except as noted in the HPI.  Physical Exam Updated Vital Signs BP 171/99   Pulse 80   Temp 97.7 F (36.5 C) (Oral)   Resp 20   Ht 6' (1.829 m)   Wt 86.2 kg   SpO2 98%   BMI 25.77 kg/m   Physical Exam  Constitutional: He is oriented to person, place, and time. He appears well-developed and well-nourished. No distress.  HENT:  Head: Normocephalic and atraumatic.  Right Ear: Tympanic membrane, external ear and ear canal normal.  Left Ear: Tympanic membrane, external ear and ear canal normal.  Nose: Nose normal.  Mouth/Throat: Uvula is midline, oropharynx is clear and moist and mucous  membranes are normal. No trismus in the jaw. No oropharyngeal exudate, posterior oropharyngeal erythema or tonsillar abscesses.  Eyes: EOM are normal. Pupils are equal, round, and reactive to light.  Neck: Normal range of motion. Neck supple. No tracheal deviation present.  Cardiovascular: Normal rate, regular rhythm, S1 normal, S2 normal, normal heart sounds, intact distal pulses and normal pulses.   Pulmonary/Chest: Effort normal and breath sounds normal. No respiratory distress. He has no decreased breath sounds. He has no wheezes. He has no rhonchi. He has no rales.  Abdominal: Normal appearance and bowel sounds are normal. There is no tenderness.  Musculoskeletal: Normal range of motion.  Neurological: He is alert and oriented to person, place, and time.  Skin: Skin is warm and dry.  Psychiatric: He has a normal mood and affect. His speech is normal and behavior is normal. Thought content normal.   ED Treatments / Results  Labs (all labs ordered are listed, but only abnormal results are displayed) Labs Reviewed  BASIC METABOLIC PANEL - Abnormal; Notable for the following:       Result Value   Glucose, Bld 128 (*)    All other components within normal limits  HEPATIC FUNCTION PANEL - Abnormal; Notable for the following:    Total Bilirubin 1.4 (*)    Indirect Bilirubin 1.2 (*)    All other components within normal limits  CBC  PROTIME-INR  LIPID PANEL  TROPONIN I  TROPONIN I  I-STAT TROPOININ, ED    EKG  EKG Interpretation  Date/Time:  Friday October 25 2016 16:21:14 EST Ventricular Rate:  89 PR Interval:  166 QRS Duration: 86 QT Interval:  370 QTC Calculation: 450 R Axis:   65 Text Interpretation:  Normal sinus rhythm Possible Left atrial enlargement No significant change since last tracing Confirmed by Anitra Lauth  MD, Alphonzo Lemmings ([id number]) on 10/25/2016 4:52:09 PM       Radiology Dg Chest Portable 1 View  Result Date: 10/25/2016 CLINICAL DATA:  Left-sided chest and neck pain  starting this morning EXAM: PORTABLE CHEST 1 VIEW COMPARISON:  None. FINDINGS: The heart size and mediastinal contours are within normal limits. Slightly low lung volumes. Minimal atelectasis noted at the right lung base. No pulmonary consolidation, effusion or pneumothorax. No overt pulmonary edema. No acute nor suspicious osseous abnormalities. IMPRESSION: No active disease.  Slightly low lung volumes. Electronically Signed   By: Tollie Eth M.D.   On: 10/25/2016 17:49    Procedures Procedures (including critical care time)  Medications Ordered in ED Medications  nitroGLYCERIN (NITROLINGUAL) 0.4 MG/SPRAY spray 1 spray (1 spray Sublingual Given 10/25/16 1712)  ondansetron (ZOFRAN-ODT) 4 MG disintegrating tablet (4 mg  Given 10/25/16 1632)  fentaNYL (SUBLIMAZE) injection 50 mcg (50 mcg Intravenous Given 10/25/16 1638)  aspirin chewable tablet 324 mg (324 mg  Oral Given 10/25/16 1638)     Initial Impression / Assessment and Plan / ED Course  I have reviewed the triage vital signs and the nursing notes.  Pertinent labs & imaging results that were available during my care of the patient were reviewed by me and considered in my medical decision making (see chart for details).  Final Clinical Impressions(s) / ED Diagnoses  {I have reviewed and evaluated the relevant laboratory values. {I have reviewed and evaluated the relevant imaging studies. {I have interpreted the relevant EKG. {I have reviewed the relevant previous healthcare records. {I have reviewed EMS Documentation. {I obtained HPI from historian. {Patient discussed with supervising physician.  ED Course:  Assessment: Pt is a 61 y.o. male presents with N/V, Diaphoresis and left chest/shoulder pain PTA. Hx CAD in 2013 with stent placement. Given 325 ASA and Nitro in ED with improvement. Concern for cardiac etiology of Chest Pain. Cardiology has been consulted and will see patient in the ED for likely admit. Pt has been re-evaluated prior to  consult and VSS, NAD, heart RRR, pain 0/10, lungs CTAB. No acute abnormalities found on EKG and first round of cardiac enzymes negative.   6:54 PM- Cards has seen. Recommended serial Troponins. Doubt ACS. If trop negative, pursue [employer]. If negative, GI follow up. Admit to medicine.  Disposition/Plan:  Admit Pt acknowledges and agrees with plan  Supervising Physician Gwyneth Sprout, MD  Final diagnoses:  Chest pain, unspecified type    New Prescriptions New Prescriptions   No medications on file     Audry Pili, PA-C 10/25/16 1929    Gwyneth Sprout, MD 10/27/16 2359

## 2016-10-25 NOTE — Progress Notes (Signed)
Pt arrived accompanied by wife and tech. He is alert and orient. Pt walked to bed from stretcher. He is resting comfortably in bed. He denies any needs or pain at this time. Call bell within reach.

## 2016-10-25 NOTE — Consult Note (Addendum)
CARDIOLOGY CONSULT NOTE  Patient ID: Ricky Hughes MRN: [id number] DOB/AGE: [birth year] 61 y.o.  Admit date: 10/25/2016 Primary Physician   Maximiano CossHUNGARLAND,JOHN DAVID, MD Primary Cardiologist   None Chief Complaint    Chest pain Requesting MD  Dr. Isidoro Donningai  HPI:   The patient presents with chest, back and leg pain.  He does have a history of obstructive disease with a pervious stent in the LAD in 2010.  Last cath 2013 demonstrated a patent stent with 50 - 60% LAD stenosis beyond the stent.  OM had 20-30% stenosis and mild plaque in the RCA.  EF was 70%.  He was managed medically.   He reports that he has had a long history of pain off an on.  Today it seemed to be worse and his co worker brought him to the ED.  His pain is substernal and radiates through to his back under his left shoulder.  It is a stabbing pain.  Today he had significant vomiting.  He also describes cramping in his calf muscles.  Today it was so severe that he had to lie on the floor.  His symptoms started with his head pounding.  He has had these same symptoms many times before.  He does occasionally take NTG for this.  He doesn't usually seek medical care.  He doesn't think this is like his previous angina.  It is not like his previous GERD.    He is active but describes calf pain with walking.  He has residual arm weakness and balance issues following a neck surgery a few years ago.    MONTSH YEARS OF THIS PAIN   Past Medical History:  Diagnosis Date  . Coronary artery disease    s/p PCI to LAD 2010; Cath 04/20/12  revealed single vessel CAD w/ patent stent in mid LAD & mod non flow limiting disease beyond the stented segment, mild non-obstructive dz in LCx and RCA, nl LV systolic fxn, no evidence of aortic dissection  . GERD (gastroesophageal reflux disease)   . Headache(784.0)   . Hypertension   . Ulcer [facility](HCC)     Past Surgical History:  Procedure Laterality Date  . CARDIAC CATHETERIZATION    . CHOLECYSTECTOMY    .  CORONARY ANGIOPLASTY WITH STENT PLACEMENT    . LEFT HEART CATHETERIZATION WITH CORONARY ANGIOGRAM N/A 04/20/2012   Procedure: LEFT HEART CATHETERIZATION WITH CORONARY ANGIOGRAM;  Surgeon: Kathleene Hazelhristopher D McAlhany, MD;  [facility];  Service: Cardiovascular;  Laterality: N/A;    No Known Allergies No current facility-administered medications on file prior to encounter.    Current Outpatient Prescriptions on File Prior to Encounter  Medication Sig Dispense Refill  . aspirin 81 MG tablet Take 1 tablet (81 mg total) by mouth daily.    Marland Kitchen. atorvastatin (LIPITOR) 40 MG tablet Take 40 mg by mouth daily.    . clopidogrel (PLAVIX) 75 MG tablet Take 75 mg by mouth daily.    Marland Kitchen. lisinopril (PRINIVIL,ZESTRIL) 40 MG tablet Take 40 mg by mouth daily.    . metoprolol succinate (TOPROL-XL) 25 MG 24 hr tablet Take 25 mg by mouth daily.    . nitroGLYCERIN (NITROSTAT) 0.4 MG SL tablet Place 0.4 mg under the tongue every 5 (five) minutes as needed. For chest pain.    Marland Kitchen. omeprazole (PRILOSEC) 20 MG capsule Take 20 mg by mouth daily.     Social History   Social History  . Marital status: Married    Spouse name: N/A  .  Number of children: N/A  . Years of education: N/A   Occupational History  . Emergency planning/management officer    Social History Main Topics  . Smoking status: Never Smoker  . Smokeless tobacco: Never Used  . Alcohol use Yes     Comment: rare  . Drug use: No  . Sexual activity: Yes   Other Topics Concern  . Not on file   Social History Narrative  . No narrative on file    Family History  Problem Relation Age of Onset  . Heart attack Mother 47    Deid age 3  . Heart attack Father 62  . CAD Brother 31    CABG     ROS:  Balance issues.  Weakness.  Otherwise as stated in the HPI and negative for all other systems.  Physical Exam: Blood pressure 151/95, pulse 83, temperature 97.7 F (36.5 C), temperature source Oral, resp. rate 19, height 6' (1.829 m), weight 190 lb (86.2 kg), SpO2 100 %.    GENERAL:  Well appearing HEENT:  Pupils equal round and reactive, fundi not visualized, oral mucosa unremarkable NECK:  No jugular venous distention, waveform within normal limits, carotid upstroke brisk and symmetric, no bruits, no thyromegaly LYMPHATICS:  No cervical, inguinal adenopathy LUNGS:  Clear to auscultation bilaterally BACK:  No CVA tenderness CHEST:  Unremarkable HEART:  PMI not displaced or sustained,S1 and S2 within normal limits, no S3, no S4, no clicks, no rubs, no murmurs ABD:  Flat, positive bowel sounds normal in frequency in pitch, no bruits, no rebound, no guarding, no midline pulsatile mass, no hepatomegaly, no splenomegaly EXT:  2 plus pulses throughout, no edema, no cyanosis no clubbing SKIN:  No rashes no nodules NEURO:  Cranial nerves II through XII grossly intact, bilateral decreased grip strength PSYCH:  Cognitively intact, oriented to person place and time  Labs: Lab Results  Component Value Date   BUN 14 10/25/2016   Lab Results  Component Value Date   CREATININE 0.79 10/25/2016   Lab Results  Component Value Date   NA 138 10/25/2016   K 3.5 10/25/2016   CL 103 10/25/2016   CO2 24 10/25/2016   Lab Results  Component Value Date   TROPONINI <0.30 04/21/2012   Lab Results  Component Value Date   WBC 8.0 10/25/2016   HGB 15.2 10/25/2016   HCT 43.8 10/25/2016   MCV 86.9 10/25/2016   PLT 177 10/25/2016   No results found for: CHOL, HDL, LDLCALC, LDLDIRECT, TRIG, CHOLHDL No results found for: ALT, AST, GGT, ALKPHOS, BILITOT    Radiology:  CXR:  The heart size and mediastinal contours are within normal limits. Slightly low lung volumes. Minimal atelectasis noted at the right lung base. No pulmonary consolidation, effusion or pneumothorax. No overt pulmonary edema. No acute nor suspicious osseous abnormalities.  EKG:  NSR, rate 89, axis WNL, intervals WNL, poor anterior R wave progression.  No acute ST T wave changes.  10/25/2016  ASSESSMENT  AND PLAN:    CHEST PAIN:  The pain is somewhat atypical.  He has no objective evidence of ischemia.  However, he has known CAD.  I would suggest ruling out MI.  If he rules out and has no further pain he can have a [employer].  (I will not order but will keep NPO.)  I might suggest that if this work up is negative he should see GI.  He takes lots of Goodie powder and has a distant history of ulcer.  HTN:  Continue current therapy.  DYSLIPIDEMIA:  Check lipid profile and liver enzymes.    SignedRollene Rotunda 10/25/2016, 5:59 PM

## 2016-10-25 NOTE — H&P (Signed)
History and Physical    Ricky Hughes:[id number] DOB: 02/10/1956 DOA: 10/25/2016  PCP: Maximiano Coss, MD   Patient coming from: Home  Chief Complaint: Chest, neck, back, and leg pain with N/V   HPI: Ricky Hughes is a 61 y.o. male with medical history significant for coronary artery disease with stent to LAD, GERD, peptic ulcer disease, hypertension, and hyperlipidemia, now presenting to the emergency department for evaluation of multiple complaints including pain in the left chest, neck, back, and nausea with nonbloody nonbilious vomiting. Patient reports that he was in his usual state of health this morning when he got to work, but shortly after arriving began to develop a pain in the left neck and left chest, described as severe, radiating through to the back, constant, and worsening over the course of the day. There was no abdominal pain, but there was associated nausea with nonbloody nonbilious vomiting. Patient laid on the floor of his office, raising concern of his coworkers. They brought him something to eat, but he vomited it back up. He denies any alleviating or exacerbating factors. Did not feel better after vomiting. He describes his symptoms as similar to those he experienced in 2013 when he reports having an acute MI. He has history of peptic ulcer disease, but reports having very prominent indigestion and reflux symptoms at that time and has not been experiencing those now. Patient reports experiencing the same symptoms on occasion over the past 1-2 years, but never quite this severe. He was eventually brought into the emergency department by coworkers for evaluation.  ED Course: Upon arrival to the ED, patient is found to be afebrile, saturating well on room air, and with vital signs stable. EKG features a normal sinus rhythm with P-wave morphology suggesting LAE. Chest x-ray features slightly low lung volumes, but is negative for acute cardiopulmonary disease. Chemistry  panel is notable for very mild elevation in total bilirubin 1.4, with direct component of 1.2. CBC is unremarkable, INR is within the normal limits, and initial troponin is 0.00. Patient was treated with a dose of sublingual nitroglycerin in the emergency department and reportedly experienced some mild improvement with this. He was given a dose of fentanyl 50 g and reports resolution of his pain with this. 3 return 24 mg aspirin was given and cardiology was consulted by the ED physician. Cardiology has evaluated the patient in the emergency department and advised medical admission to rule out ACS. Patient has remained hemodynamically stable in the emergency department. He is in no respiratory distress and there is been no recurrence in his chest pain. He will be observed on the telemetry unit for ongoing evaluation and management aforementioned symptoms, concerning for possible ACS.  Review of Systems:  All other systems reviewed and apart from HPI, are negative.  Past Medical History:  Diagnosis Date  . Coronary artery disease    s/p PCI to LAD 2010; Cath 04/20/12  revealed single vessel CAD w/ patent stent in mid LAD & mod non flow limiting disease beyond the stented segment, mild non-obstructive dz in LCx and RCA, nl LV systolic fxn, no evidence of aortic dissection  . GERD (gastroesophageal reflux disease)   . Headache(784.0)   . Hypertension   . Ulcer [facility])     Past Surgical History:  Procedure Laterality Date  . CARDIAC CATHETERIZATION    . CERVICAL SPINE SURGERY    . CHOLECYSTECTOMY    . CORONARY ANGIOPLASTY WITH STENT PLACEMENT    . LEFT HEART CATHETERIZATION  WITH CORONARY ANGIOGRAM N/A 04/20/2012   Procedure: LEFT HEART CATHETERIZATION WITH CORONARY ANGIOGRAM;  Surgeon: Kathleene Hazel, MD;  Location: [facility] CATH LAB;  Service: Cardiovascular;  Laterality: N/A;     reports that he has never smoked. He has never used smokeless tobacco. He reports that he drinks alcohol. He reports  that he does not use drugs.  No Known Allergies  Family History  Problem Relation Age of Onset  . Heart attack Mother 74    Deid age 26  . Heart attack Father 53  . CAD Brother 54    CABG     Prior to Admission medications   Medication Sig Start Date End Date Taking? Authorizing Provider  aspirin 81 MG tablet Take 1 tablet (81 mg total) by mouth daily. 04/21/12  Yes Jessica A Hope, PA-C  atorvastatin (LIPITOR) 40 MG tablet Take 40 mg by mouth daily.   Yes Historical Provider, MD  clopidogrel (PLAVIX) 75 MG tablet Take 75 mg by mouth daily.   Yes Historical Provider, MD  lisinopril (PRINIVIL,ZESTRIL) 40 MG tablet Take 40 mg by mouth daily.   Yes Historical Provider, MD  metoprolol succinate (TOPROL-XL) 25 MG 24 hr tablet Take 25 mg by mouth daily.   Yes Historical Provider, MD  nitroGLYCERIN (NITROSTAT) 0.4 MG SL tablet Place 0.4 mg under the tongue every 5 (five) minutes as needed. For chest pain.   Yes Historical Provider, MD  omeprazole (PRILOSEC) 20 MG capsule Take 20 mg by mouth daily.   Yes Historical Provider, MD    Physical Exam: Vitals:   10/25/16 1800 10/25/16 1830 10/25/16 1900 10/25/16 1930  BP: 157/89 128/97 133/89 129/88  Pulse: 80 71 81 76  Resp: 19 23 22 24   Temp:      TempSrc:      SpO2: 97% 98% 98% 98%  Weight:      Height:          Constitutional: NAD, calm, comfortable Eyes: PERTLA, lids and conjunctivae normal ENMT: Mucous membranes are moist. Posterior pharynx clear of any exudate or lesions.   Neck: normal, supple, no masses, no thyromegaly Respiratory: clear to auscultation bilaterally, no wheezing, no crackles. Normal respiratory effort.   Cardiovascular: S1 & S2 heard, regular rate and rhythm. No extremity edema. No significant JVD. Abdomen: No distension, no tenderness, no masses palpated. Bowel sounds normal.  Musculoskeletal: no clubbing / cyanosis. No joint deformity upper and lower extremities. Normal muscle tone.  Skin: no significant  rashes, lesions, ulcers. Warm, dry, well-perfused. Neurologic: CN 2-12 grossly intact. Sensation intact, DTR normal. Strength 5/5 in all 4 limbs.  Psychiatric: Normal judgment and insight. Alert and oriented x 3. Normal mood and affect.     Labs on Admission: I have personally reviewed following labs and imaging studies  CBC:  Recent Labs Lab 10/25/16 1636  WBC 8.0  HGB 15.2  HCT 43.8  MCV 86.9  PLT 177   Basic Metabolic Panel:  Recent Labs Lab 10/25/16 1636  NA 138  K 3.5  CL 103  CO2 24  GLUCOSE 128*  BUN 14  CREATININE 0.79  CALCIUM 9.5   GFR: Estimated Creatinine Clearance: 105.1 mL/min (by C-G formula based on SCr of 0.79 mg/dL). Liver Function Tests:  Recent Labs Lab 10/25/16 1624  AST 25  ALT 20  ALKPHOS 54  BILITOT 1.4*  PROT 7.0  ALBUMIN 4.3   No results for input(s): LIPASE, AMYLASE in the last 168 hours. No results for input(s): AMMONIA in the last  168 hours. Coagulation Profile:  Recent Labs Lab 10/25/16 1626  INR 1.07   Cardiac Enzymes: No results for input(s): CKTOTAL, CKMB, CKMBINDEX, TROPONINI in the last 168 hours. BNP (last 3 results) No results for input(s): PROBNP in the last 8760 hours. HbA1C: No results for input(s): HGBA1C in the last 72 hours. CBG: No results for input(s): GLUCAP in the last 168 hours. Lipid Profile: No results for input(s): CHOL, HDL, LDLCALC, TRIG, CHOLHDL, LDLDIRECT in the last 72 hours. Thyroid Function Tests: No results for input(s): TSH, T4TOTAL, FREET4, T3FREE, THYROIDAB in the last 72 hours. Anemia Panel: No results for input(s): VITAMINB12, FOLATE, FERRITIN, TIBC, IRON, RETICCTPCT in the last 72 hours. Urine analysis: No results found for: COLORURINE, APPEARANCEUR, LABSPEC, PHURINE, GLUCOSEU, HGBUR, BILIRUBINUR, KETONESUR, PROTEINUR, UROBILINOGEN, NITRITE, LEUKOCYTESUR Sepsis Labs: @LABRCNTIP (procalcitonin:4,lacticidven:4) )No results found for this or any previous visit (from the past 240  hour(s)).   Radiological Exams on Admission: Dg Chest Portable 1 View  Result Date: 10/25/2016 CLINICAL DATA:  Left-sided chest and neck pain starting this morning EXAM: PORTABLE CHEST 1 VIEW COMPARISON:  None. FINDINGS: The heart size and mediastinal contours are within normal limits. Slightly low lung volumes. Minimal atelectasis noted at the right lung base. No pulmonary consolidation, effusion or pneumothorax. No overt pulmonary edema. No acute nor suspicious osseous abnormalities. IMPRESSION: No active disease.  Slightly low lung volumes. Electronically Signed   By: Tollie Ethavid  Kwon M.D.   On: 10/25/2016 17:49    EKG: Independently reviewed. NSR, LAE suggested  Assessment/Plan  1. Chest pain, CAD   - Pt has hx of CAD with stent to mid-LAD in 2010  - He underwent cardiac cath in 2013 and was noted to have 50-60% LAD stenosis beyond the stent, 20-30% OM stenosis, and mild plaque in RCA with EF 70%; medical management was advised  - Now presenting with a constellation of complaints that is not typical of ACS, but he reports them to be similar to prior MI  - ASA 324 mg given in ED; NTG given x1 with mild improvement; pain resolved with fentanyl 50 mcg in ED and has not recurred - Cardiology is consulting and much appreciated; he is being considered for stress-testing in the morning  - He has some prominent GI sxs and hx of GERD with PUD; will treat this aggressively  - Monitor on telemetry for ischemic changes, obtain serial troponin measurements, and repeat EKG  - Continue ASA 81, Plavix, Lipitor, lisinopril as tolerated   2. Hypertension  - BP elevated on presentation, normalized after treatment with NTG and fentanyl in ED  - Managed with lisinopril at home, will continue as tolerated   3. Hyperlipidemia  - Fasting lipid panel ordered - Transaminases wnl  - Continue Lipitor   4. GERD, hx of PUD - No EGD report on file, reports remote hx of PUD  - Managed at home with Prilosec qD, will  continue PPI  - Given the atypical CP with prominent GI features, will add Pepcid 20 mg IV BID and prn GI cocktail for now    5. Nausea, vomiting - No abd pain, diarrhea, melena, or hematochezia  - Abd exam is benign; no fever or leukocytosis  - Ruling-out for ACS as above  - PRN anti-emetics, gentle IVF hydration, acid-suppression as above, monitor lytes    DVT prophylaxis: sq Lovenox  Code Status: Full  Family Communication: Friend updated at patient's request Disposition Plan: Observe on telemetry Consults called: Cardiology Admission status: Observation    Marcial Pacasimothy  Francesco Runner, MD [facility] Pager [phone]  If 7PM-7AM, please contact night-coverage [email or web address] Password [facility]  10/25/2016, 7:58 PM

## 2016-10-25 NOTE — ED Triage Notes (Signed)
Per Pt, Pt started to have left chest and neck pain that started this morning. Pt reports it progressing throughout the day. Pt came into the ED with extreme nausea and vomiting. Diaphoretic and pale. Hx of three stent placements.

## 2016-10-26 ENCOUNTER — Observation Stay (HOSPITAL_COMMUNITY): Payer: Managed Care, Other (non HMO)

## 2016-10-26 ENCOUNTER — Telehealth: Payer: Self-pay | Admitting: Physician Assistant

## 2016-10-26 DIAGNOSIS — Z8711 Personal history of peptic ulcer disease: Secondary | ICD-10-CM | POA: Diagnosis not present

## 2016-10-26 DIAGNOSIS — I209 Angina pectoris, unspecified: Secondary | ICD-10-CM | POA: Diagnosis not present

## 2016-10-26 DIAGNOSIS — R079 Chest pain, unspecified: Secondary | ICD-10-CM

## 2016-10-26 DIAGNOSIS — I25110 Atherosclerotic heart disease of native coronary artery with unstable angina pectoris: Secondary | ICD-10-CM | POA: Diagnosis not present

## 2016-10-26 DIAGNOSIS — I2510 Atherosclerotic heart disease of native coronary artery without angina pectoris: Secondary | ICD-10-CM | POA: Diagnosis not present

## 2016-10-26 DIAGNOSIS — E785 Hyperlipidemia, unspecified: Secondary | ICD-10-CM | POA: Diagnosis not present

## 2016-10-26 DIAGNOSIS — I10 Essential (primary) hypertension: Secondary | ICD-10-CM | POA: Diagnosis not present

## 2016-10-26 DIAGNOSIS — I25119 Atherosclerotic heart disease of native coronary artery with unspecified angina pectoris: Secondary | ICD-10-CM | POA: Diagnosis not present

## 2016-10-26 DIAGNOSIS — I200 Unstable angina: Secondary | ICD-10-CM | POA: Diagnosis not present

## 2016-10-26 LAB — LIPID PANEL
CHOL/HDL RATIO: 2.6 ratio
Cholesterol: 143 mg/dL (ref 0–200)
HDL: 54 mg/dL (ref >40)
LDL Cholesterol: 81 mg/dL (ref 0–99)
TRIGLYCERIDES: 41 mg/dL (ref <150)
VLDL: 8 mg/dL (ref 0–40)

## 2016-10-26 LAB — NM MYOCAR MULTI W/SPECT W/WALL MOTION / EF
CSEPPHR: 85 {beats}/min
Rest HR: 57 {beats}/min

## 2016-10-26 LAB — TROPONIN I
Troponin I: <0.03 ng/mL (ref <0.03)
Troponin I: <0.03 ng/mL (ref <0.03)

## 2016-10-26 LAB — HIV ANTIBODY (ROUTINE TESTING W REFLEX): HIV SCREEN 4TH GENERATION: NONREACTIVE

## 2016-10-26 MED ORDER — TECHNETIUM TC 99M TETROFOSMIN IV KIT
30.0000 | PACK | Freq: Once | INTRAVENOUS | Status: AC | PRN
  Administered 2016-10-26: 30 via INTRAVENOUS

## 2016-10-26 MED ORDER — REGADENOSON 0.4 MG/5ML IV SOLN
INTRAVENOUS | Status: AC
  Filled 2016-10-26: qty 5

## 2016-10-26 MED ORDER — NITROGLYCERIN 0.4 MG SL SUBL
SUBLINGUAL_TABLET | SUBLINGUAL | Status: AC
  Administered 2016-10-26 (×2): 0.4 mg
  Filled 2016-10-26: qty 1

## 2016-10-26 MED ORDER — TECHNETIUM TC 99M TETROFOSMIN IV KIT
10.0000 | PACK | Freq: Once | INTRAVENOUS | Status: AC | PRN
  Administered 2016-10-26: 10 via INTRAVENOUS

## 2016-10-26 MED ORDER — REGADENOSON 0.4 MG/5ML IV SOLN
0.4000 mg | Freq: Once | INTRAVENOUS | Status: AC
  Administered 2016-10-26: 0.4 mg via INTRAVENOUS
  Filled 2016-10-26: qty 5

## 2016-10-26 NOTE — Progress Notes (Signed)
    NUCLEAR STRESS TEST INTERMEDIATE RISK. WILL KEEP HIM UNTIL Monday FOR A HEART CATH. DISCUSSED WITH PATIENT AND HE UNDERSTANDS AND AGREES.   Cline CrockKathryn Zakariyah Freimark PA-C  [facility]

## 2016-10-26 NOTE — Telephone Encounter (Signed)
    Patient returned my call about K being 6.9. She has agreed to hold Valsartan and all potassium supplementation and will come into the lab on Monday for repeat BMET.Will call her with further instructions after that.   Kearney Evitt PA-C  [facility]  

## 2016-10-26 NOTE — Progress Notes (Signed)
     The patient was seen in nuclear medicine for a lexiscan myoview. He tolerated the procedure well. No acute ST or TW changes on ECG. Await nuclear images.    Donnella Morford Stern PA-C  [facility]    

## 2016-10-26 NOTE — Telephone Encounter (Signed)
This note was written in error and for a different patient. PLEASE DISREGARD.

## 2016-10-26 NOTE — Progress Notes (Signed)
Progress Note  Patient Name: Ricky Hughes Date of Encounter: 10/26/2016  Primary Cardiologist:  Houston Siren, [place],  [place] [facility] [place]  Subjective   61 yo with hx of CAD .  Admitted with N, V, chest pain  troponins are negative    Inpatient Medications    Scheduled Meds: . aspirin EC  81 mg Oral Daily  . atorvastatin  40 mg Oral Daily  . clopidogrel  75 mg Oral Daily  . enoxaparin (LOVENOX) injection  40 mg Subcutaneous Q24H  . famotidine (PEPCID) IV  20 mg Intravenous Q12H  . lisinopril  40 mg Oral Daily  . metoprolol succinate  25 mg Oral Daily  . pantoprazole  40 mg Oral Daily  . regadenoson  0.4 mg Intravenous Once   Continuous Infusions:  PRN Meds: acetaminophen, gi cocktail, morphine injection, ondansetron (ZOFRAN) IV   Vital Signs    Vitals:   10/25/16 1930 10/25/16 1949 10/25/16 2048 10/26/16 0459  BP: 129/88  139/81 137/83  Pulse: 76  70 65  Resp: 24  20 18   Temp:   97.5 F (36.4 C) 97.5 F (36.4 C)  TempSrc:   Oral Oral  SpO2: 98% 97% 97% 99%  Weight:   187 lb 3.2 oz (84.9 kg)   Height:   6' (1.829 m)    No intake or output data in the 24 hours ending 10/26/16 1003 Filed Weights   10/25/16 1623 10/25/16 2048  Weight: 190 lb (86.2 kg) 187 lb 3.2 oz (84.9 kg)    Telemetry    NSR  - Personally Reviewed  ECG    NSR , no ST or T wave changes  - Personally Reviewed  Physical Exam   GEN: No acute distress.   Neck: No JVD Cardiac: RRR, no murmurs, rubs, or gallops.  Respiratory: Clear to auscultation bilaterally. GI: Soft, nontender, non-distended  MS: No edema; No deformity. Neuro:  Nonfocal  Psych: Normal affect   Labs    Chemistry Recent Labs Lab 10/25/16 1624 10/25/16 1636  NA  --  138  K  --  3.5  CL  --  103  CO2  --  24  GLUCOSE  --  128*  BUN  --  14  CREATININE  --  0.79  CALCIUM  --  9.5  PROT 7.0  --   ALBUMIN 4.3  --   AST 25  --   ALT 20  --   ALKPHOS 54  --   BILITOT 1.4*  --   GFRNONAA  --  >60  GFRAA   --  >60  ANIONGAP  --  11     Hematology Recent Labs Lab 10/25/16 1636  WBC 8.0  RBC 5.04  HGB 15.2  HCT 43.8  MCV 86.9  MCH 30.2  MCHC 34.7  RDW 13.8  PLT 177    Cardiac Enzymes Recent Labs Lab 10/25/16 1942 10/26/16 0232 10/26/16 0736  TROPONINI <0.03 <0.03 <0.03    Recent Labs Lab 10/25/16 1642  TROPIPOC 0.00     BNPNo results for input(s): BNP, PROBNP in the last 168 hours.   DDimer No results for input(s): DDIMER in the last 168 hours.   Radiology    Dg Chest Portable 1 View  Result Date: 10/25/2016 CLINICAL DATA:  Left-sided chest and neck pain starting this morning EXAM: PORTABLE CHEST 1 VIEW COMPARISON:  None. FINDINGS: The heart size and mediastinal contours are within normal limits. Slightly low lung volumes. Minimal atelectasis noted at the right  lung base. No pulmonary consolidation, effusion or pneumothorax. No overt pulmonary edema. No acute nor suspicious osseous abnormalities. IMPRESSION: No active disease.  Slightly low lung volumes. Electronically Signed   By: Tollie Ethavid  Kwon M.D.   On: 10/25/2016 17:49    Cardiac Studies     Patient Profile     61 y.o. male hx of CAD and stenting in the past.  Presents with CP , Nausea, vomitting yesterday  Troponin levels are negative   Assessment & Plan    1. Chest pain  Atypical, associated with N/V Troponins are negative Will schedule a Lexiscan myoview for today .   [facility]   2. Hyperlipidemia:   Levels are good on atorvastatin  3. Essential HTN:  continue meds    Signed, Kristeen MissPhilip Maniya Donovan, MD  10/26/2016, 10:03 AM

## 2016-10-26 NOTE — Progress Notes (Signed)
PROGRESS NOTE    Ricky Hughes  ZOX:[id number] DOB: 17-Nov-1955 DOA: 10/25/2016 PCP: Maximiano Coss, MD    Brief Narrative:  This is a 61 year old male who presents to the hospital chief complaint of chest pain. He is known to have coronary disease status post stenting in the LAD. He developed acute left neck/left chest pain, severe in intensity, radiating to his back, constant, worsening over the course of the day. On initial physical examination blood pressure 157/89, heart rate 80, respiratory rate 19, saturation 97%. His lungs were clear to auscultation, heart S1-S2 present rhythmic, abdomen soft and nontender, lower extremities no edema. Sodium 138, potassium 3.5, chloride 103, bicarbonate 24, glucose 120, BUN 14, creatinine 0.79, less than troponin 0.03. White count 8.0, hemoglobin 15.2, hematocrit 43.8, platelets 177. Chest x-ray clear for infiltrates. EKG sinus rhythm with left atrial enlargement. No ST elevations or ST depressions, no significant T wave abnormalities.   The patient was admitted to the hospital with the working diagnosis of atypical chest pain rule out for acute coronary syndrome   Assessment & Plan:   Principal Problem:   Chest pain Active Problems:   Coronary atherosclerosis of native coronary artery   History of peptic ulcer disease   Hypertension   Hyperlipidemia   1. Atypical chest pain. Patient was admitted to the medical unit with remote telemetry monitor, he was continued on aspirin, Plavix, statin, metoprolol, lisinopril. Patient ruled out for acute cardiac ischemia with serial cardiac enzymes. Patient was seen by cardiology and he underwent a nuclear stress test, reported as intermediate, evidence of inducible ischemia involving the mid to distal inferior lateral wall. Positive scar involving  the basilar aspect inferior wall. Plan for cardiac catheterization in am per cardiology.   2. Hypertension. Continue blood pressure control with metoprolol,  lisinopril.  3. Dyslipidemia. Continue statin therapy.  4. Gastric esophageal reflux disease. Patient was placed on antiacid therapy.   DVT prophylaxis: enoxaparin  Code Status: full  Family Communication: No family at the bedside  Disposition Plan: home   Consultants:   Cardiology   Procedures:   Antimicrobials:    Subjective: No further chest pain or dyspnea, no nausea or vomiting,   Objective: Vitals:   10/26/16 1120 10/26/16 1129 10/26/16 1130 10/26/16 1132  BP: (!) 167/93 (!) 160/95 (!) 172/95 (!) 171/96  Pulse:      Resp:      Temp:      TempSrc:      SpO2:      Weight:      Height:       No intake or output data in the 24 hours ending 10/26/16 1629 Filed Weights   10/25/16 1623 10/25/16 2048  Weight: 86.2 kg (190 lb) 84.9 kg (187 lb 3.2 oz)    Examination:  General exam: not in pain or dyspnea E ENT: no pallor or icterus Respiratory system: Clear to auscultation. Respiratory effort normal. Cardiovascular system: S1 & S2 heard, RRR. No JVD, murmurs, rubs, gallops or clicks. No pedal edema. Gastrointestinal system: Abdomen is nondistended, soft and nontender. No organomegaly or masses felt. Normal bowel sounds heard. Central nervous system: Alert and oriented. No focal neurological deficits. Extremities: Symmetric 5 x 5 power. Skin: No rashes, lesions or ulcers     Data Reviewed: I have personally reviewed following labs and imaging studies  CBC:  Recent Labs Lab 10/25/16 1636  WBC 8.0  HGB 15.2  HCT 43.8  MCV 86.9  PLT 177   Basic Metabolic Panel:  Recent Labs Lab 10/25/16 1636  NA 138  K 3.5  CL 103  CO2 24  GLUCOSE 128*  BUN 14  CREATININE 0.79  CALCIUM 9.5   GFR: Estimated Creatinine Clearance: 105.1 mL/min (by C-G formula based on SCr of 0.79 mg/dL). Liver Function Tests:  Recent Labs Lab 10/25/16 1624  AST 25  ALT 20  ALKPHOS 54  BILITOT 1.4*  PROT 7.0  ALBUMIN 4.3   No results for input(s): LIPASE, AMYLASE  in the last 168 hours. No results for input(s): AMMONIA in the last 168 hours. Coagulation Profile:  Recent Labs Lab 10/25/16 1626  INR 1.07   Cardiac Enzymes:  Recent Labs Lab 10/25/16 1942 10/26/16 0232 10/26/16 0736  TROPONINI <0.03 <0.03 <0.03   BNP (last 3 results) No results for input(s): PROBNP in the last 8760 hours. HbA1C: No results for input(s): HGBA1C in the last 72 hours. CBG: No results for input(s): GLUCAP in the last 168 hours. Lipid Profile:  Recent Labs  10/26/16 0232  CHOL 143  HDL 54  LDLCALC 81  TRIG 41  CHOLHDL 2.6   Thyroid Function Tests: No results for input(s): TSH, T4TOTAL, FREET4, T3FREE, THYROIDAB in the last 72 hours. Anemia Panel: No results for input(s): VITAMINB12, FOLATE, FERRITIN, TIBC, IRON, RETICCTPCT in the last 72 hours. Sepsis Labs: No results for input(s): PROCALCITON, LATICACIDVEN in the last 168 hours.  No results found for this or any previous visit (from the past 240 hour(s)).       Radiology Studies: Nm Myocar Multi W/spect W/wall Motion / Ef  Result Date: 10/26/2016 CLINICAL DATA:  Chest pain and history of coronary artery disease with prior LAD stent placement. EXAM: MYOCARDIAL IMAGING WITH SPECT (REST AND PHARMACOLOGIC-STRESS) GATED LEFT VENTRICULAR WALL MOTION STUDY LEFT VENTRICULAR EJECTION FRACTION TECHNIQUE: Standard myocardial SPECT imaging was performed after resting intravenous injection of 10 mCi Tc-5352m tetrofosmin. Subsequently, intravenous infusion of Lexiscan was performed under the supervision of the Cardiology staff. At peak effect of the drug, 30 mCi Tc-8152m tetrofosmin was injected intravenously and standard myocardial SPECT imaging was performed. Quantitative gated imaging was also performed to evaluate left ventricular wall motion, and estimate left ventricular ejection fraction. COMPARISON:  None. FINDINGS: Perfusion: There is evidence of a fixed perfusion defect involving the basilar aspect of the  inferior wall. Inducible ischemia is identified in the adjacent mid to distal inferolateral wall. Wall Motion: Normal left ventricular wall motion. No left ventricular dilation. Left Ventricular Ejection Fraction: 57 % End diastolic volume 122 ml End systolic volume 52 ml IMPRESSION: 1. Evidence of inducible ischemia involving the mid to distal inferolateral wall. There appears to be a component of scar involving the basilar aspect of the inferior wall. 2. Normal left ventricular wall motion. 3. Left ventricular ejection fraction 57% 4. Non invasive risk stratification*: Intermediate *2012 Appropriate Use Criteria for Coronary Revascularization Focused Update: J Am Coll Cardiol. 2012;59(9):857-881. http://content.dementiazones.comonlinejacc.org/article.aspx?articleid=1201161 Electronically Signed   By: Irish LackGlenn  Yamagata M.D.   On: 10/26/2016 13:43   Dg Chest Portable 1 View  Result Date: 10/25/2016 CLINICAL DATA:  Left-sided chest and neck pain starting this morning EXAM: PORTABLE CHEST 1 VIEW COMPARISON:  None. FINDINGS: The heart size and mediastinal contours are within normal limits. Slightly low lung volumes. Minimal atelectasis noted at the right lung base. No pulmonary consolidation, effusion or pneumothorax. No overt pulmonary edema. No acute nor suspicious osseous abnormalities. IMPRESSION: No active disease.  Slightly low lung volumes. Electronically Signed   By: Rene Kocheravid  Kwon M.D.  On: 10/25/2016 17:49        Scheduled Meds: . aspirin EC  81 mg Oral Daily  . atorvastatin  40 mg Oral Daily  . clopidogrel  75 mg Oral Daily  . enoxaparin (LOVENOX) injection  40 mg Subcutaneous Q24H  . famotidine (PEPCID) IV  20 mg Intravenous Q12H  . lisinopril  40 mg Oral Daily  . metoprolol succinate  25 mg Oral Daily  . pantoprazole  40 mg Oral Daily  . regadenoson       Continuous Infusions:   LOS: 0 days     Othar Curto Annett Gula, MD [facility] Pager [phone]  If 7PM-7AM, please contact  night-coverage [email or web address] Password [facility] 10/26/2016, 4:29 PM

## 2016-10-26 NOTE — Progress Notes (Signed)
Pt experienced chest pain at the beginning of shift. He reported pain 5/10 with pressure. Vitals stable. EKG performed. Nitro sublingual 2 doses were given. Pt stated he now feels better and the pain is a 1/10 right now. He occasionally experiences this at home and takes sublingual nitro at home. He is asymptomatic right now and is resting comfortably. Will continue to monitor pt closely. MD notified.

## 2016-10-26 NOTE — Progress Notes (Deleted)
.     Previous phone note written in error! Unable to delete. Please disregard.   Cline CrockKathryn Thompson PA-C  [facility]

## 2016-10-27 DIAGNOSIS — I25119 Atherosclerotic heart disease of native coronary artery with unspecified angina pectoris: Secondary | ICD-10-CM

## 2016-10-27 DIAGNOSIS — D696 Thrombocytopenia, unspecified: Secondary | ICD-10-CM | POA: Diagnosis not present

## 2016-10-27 DIAGNOSIS — R0789 Other chest pain: Secondary | ICD-10-CM | POA: Diagnosis not present

## 2016-10-27 DIAGNOSIS — Z01810 Encounter for preprocedural cardiovascular examination: Secondary | ICD-10-CM | POA: Diagnosis not present

## 2016-10-27 DIAGNOSIS — K5900 Constipation, unspecified: Secondary | ICD-10-CM | POA: Diagnosis not present

## 2016-10-27 DIAGNOSIS — I209 Angina pectoris, unspecified: Secondary | ICD-10-CM | POA: Diagnosis not present

## 2016-10-27 DIAGNOSIS — I200 Unstable angina: Secondary | ICD-10-CM | POA: Diagnosis not present

## 2016-10-27 DIAGNOSIS — K219 Gastro-esophageal reflux disease without esophagitis: Secondary | ICD-10-CM | POA: Diagnosis present

## 2016-10-27 DIAGNOSIS — I25110 Atherosclerotic heart disease of native coronary artery with unstable angina pectoris: Secondary | ICD-10-CM | POA: Diagnosis present

## 2016-10-27 DIAGNOSIS — Z7902 Long term (current) use of antithrombotics/antiplatelets: Secondary | ICD-10-CM | POA: Diagnosis not present

## 2016-10-27 DIAGNOSIS — Z8249 Family history of ischemic heart disease and other diseases of the circulatory system: Secondary | ICD-10-CM | POA: Diagnosis not present

## 2016-10-27 DIAGNOSIS — I2510 Atherosclerotic heart disease of native coronary artery without angina pectoris: Secondary | ICD-10-CM | POA: Diagnosis not present

## 2016-10-27 DIAGNOSIS — I340 Nonrheumatic mitral (valve) insufficiency: Secondary | ICD-10-CM | POA: Diagnosis present

## 2016-10-27 DIAGNOSIS — Z79899 Other long term (current) drug therapy: Secondary | ICD-10-CM | POA: Diagnosis not present

## 2016-10-27 DIAGNOSIS — E785 Hyperlipidemia, unspecified: Secondary | ICD-10-CM | POA: Diagnosis not present

## 2016-10-27 DIAGNOSIS — R079 Chest pain, unspecified: Secondary | ICD-10-CM | POA: Diagnosis present

## 2016-10-27 DIAGNOSIS — I252 Old myocardial infarction: Secondary | ICD-10-CM | POA: Diagnosis not present

## 2016-10-27 DIAGNOSIS — E7800 Pure hypercholesterolemia, unspecified: Secondary | ICD-10-CM | POA: Diagnosis not present

## 2016-10-27 DIAGNOSIS — Z8711 Personal history of peptic ulcer disease: Secondary | ICD-10-CM | POA: Diagnosis not present

## 2016-10-27 DIAGNOSIS — E8770 Fluid overload, unspecified: Secondary | ICD-10-CM | POA: Diagnosis not present

## 2016-10-27 DIAGNOSIS — I10 Essential (primary) hypertension: Secondary | ICD-10-CM | POA: Diagnosis not present

## 2016-10-27 DIAGNOSIS — Z7982 Long term (current) use of aspirin: Secondary | ICD-10-CM | POA: Diagnosis not present

## 2016-10-27 DIAGNOSIS — Z955 Presence of coronary angioplasty implant and graft: Secondary | ICD-10-CM | POA: Diagnosis not present

## 2016-10-27 LAB — CBC WITH DIFFERENTIAL/PLATELET
Basophils Absolute: 0.1 10*3/uL (ref 0.0–0.1)
Basophils Relative: 1 %
Eosinophils Absolute: 0.2 10*3/uL (ref 0.0–0.7)
Eosinophils Relative: 4 %
HEMATOCRIT: 42.2 % (ref 39.0–52.0)
HEMOGLOBIN: 13.8 g/dL (ref 13.0–17.0)
LYMPHS ABS: 1.8 10*3/uL (ref 0.7–4.0)
Lymphocytes Relative: 33 %
MCH: 29.1 pg (ref 26.0–34.0)
MCHC: 32.7 g/dL (ref 30.0–36.0)
MCV: 88.8 fL (ref 78.0–100.0)
MONOS PCT: 9 %
Monocytes Absolute: 0.5 10*3/uL (ref 0.1–1.0)
NEUTROS ABS: 3 10*3/uL (ref 1.7–7.7)
NEUTROS PCT: 53 %
Platelets: 184 10*3/uL (ref 150–400)
RBC: 4.75 MIL/uL (ref 4.22–5.81)
RDW: 14.1 % (ref 11.5–15.5)
WBC: 5.5 10*3/uL (ref 4.0–10.5)

## 2016-10-27 LAB — BASIC METABOLIC PANEL
ANION GAP: 4 — AB (ref 5–15)
BUN: 14 mg/dL (ref 6–20)
CHLORIDE: 109 mmol/L (ref 101–111)
CO2: 29 mmol/L (ref 22–32)
Calcium: 9.1 mg/dL (ref 8.9–10.3)
Creatinine, Ser: 0.75 mg/dL (ref 0.61–1.24)
GFR calc non Af Amer: >60 mL/min (ref >60)
Glucose, Bld: 97 mg/dL (ref 65–99)
POTASSIUM: 3.7 mmol/L (ref 3.5–5.1)
Sodium: 142 mmol/L (ref 135–145)

## 2016-10-27 LAB — HEPARIN LEVEL (UNFRACTIONATED): Heparin Unfractionated: 0.52 IU/mL (ref 0.30–0.70)

## 2016-10-27 MED ORDER — SODIUM CHLORIDE 0.9 % IV SOLN: 250.0000 mL | INTRAVENOUS | Status: DC | PRN

## 2016-10-27 MED ORDER — HEPARIN (PORCINE) IN NACL 100-0.45 UNIT/ML-% IJ SOLN
1150.0000 [IU]/h | INTRAMUSCULAR | Status: DC
  Administered 2016-10-27 – 2016-10-28 (×2): 1150 [IU]/h via INTRAVENOUS
  Filled 2016-10-27 (×2): qty 250

## 2016-10-27 MED ORDER — ASPIRIN 81 MG PO CHEW
81.0000 mg | CHEWABLE_TABLET | ORAL | Status: AC
  Administered 2016-10-28: 81 mg via ORAL
  Filled 2016-10-27: qty 1

## 2016-10-27 MED ORDER — SODIUM CHLORIDE 0.9% FLUSH: 3.0000 mL | INTRAVENOUS | Status: DC | PRN

## 2016-10-27 MED ORDER — HEPARIN BOLUS VIA INFUSION
4000.0000 [IU] | Freq: Once | INTRAVENOUS | Status: AC
  Administered 2016-10-27: 4000 [IU] via INTRAVENOUS
  Filled 2016-10-27: qty 4000

## 2016-10-27 MED ORDER — NITROGLYCERIN IN D5W 200-5 MCG/ML-% IV SOLN
0.0000 ug/min | INTRAVENOUS | Status: DC
  Administered 2016-11-01: 5 ug/min via INTRAVENOUS
  Filled 2016-10-27: qty 250

## 2016-10-27 MED ORDER — SODIUM CHLORIDE 0.9 % WEIGHT BASED INFUSION
1.0000 mL/kg/h | INTRAVENOUS | Status: DC
  Administered 2016-10-28: 1 mL/kg/h via INTRAVENOUS

## 2016-10-27 MED ORDER — POTASSIUM CHLORIDE 20 MEQ PO PACK
40.0000 meq | PACK | Freq: Once | ORAL | Status: AC
  Administered 2016-10-27: 40 meq via ORAL
  Filled 2016-10-27: qty 2

## 2016-10-27 MED ORDER — SODIUM CHLORIDE 0.9 % WEIGHT BASED INFUSION
3.0000 mL/kg/h | INTRAVENOUS | Status: DC
  Administered 2016-10-27: 3 mL/kg/h via INTRAVENOUS

## 2016-10-27 MED ORDER — SODIUM CHLORIDE 0.9% FLUSH
3.0000 mL | Freq: Two times a day (BID) | INTRAVENOUS | Status: DC
  Administered 2016-10-27: 3 mL via INTRAVENOUS

## 2016-10-27 NOTE — Progress Notes (Addendum)
PROGRESS NOTE    Ricky Hughes  ZOX:[id number] DOB: [birth year] DOA: 10/25/2016 PCP: Maximiano CossHUNGARLAND,Ricky DAVID, MD    Brief Narrative:  This is a 61 year old male who presents to the hospital chief complaint of chest pain. He is known to have coronary disease status post stenting in the LAD. He developed acute left neck/left chest pain, severe in intensity, radiating to his back, constant, worsening over the course of the day. On initial physical examination blood pressure 157/89, heart rate 80, respiratory rate 19, saturation 97%. His lungs were clear to auscultation, heart S1-S2 present rhythmic, abdomen soft and nontender, lower extremities no edema.Sodium 138, potassium 3.5, chloride 103, bicarbonate 24, glucose 120, BUN 14, creatinine 0.79, less than troponin 0.03. White count 8.0, hemoglobin 15.2, hematocrit 43.8, platelets 177. Chest x-ray clear for infiltrates. EKG sinus rhythm with left atrial enlargement. No ST elevations or ST depressions, no significant T wave abnormalities. The patient was admitted to the hospital with the working diagnosis of atypical chest pain rule out for acute coronary syndrome. Stress tess with intermediate risk, plan for coronary angiography.   Assessment & Plan:   Principal Problem:   Chest pain Active Problems:   Coronary atherosclerosis of native coronary artery   History of peptic ulcer disease   Hypertension   Hyperlipidemia   1. Unstable angina pectoris (present on admission). Patient had chest pain last night, started on heparin and nitroglycerin infusions. Continued on aspirin, Plavix, statin, metoprolol, lisinopril. Positive scar involving the basilar aspect inferior wall and peri-infarct ischemia. Plan for cardiac catheterization in am per cardiology. EKG personally reviewed noted normal sinus rhythm with no st elevations or depressions, no significant T wave abnormalities. Will keep K at 4.   2. Hypertension. Continue blood pressure control with  metoprolol, lisinopril, systolic blood pressure 140 to 160. No signs of hypervolemia.   3. Dyslipidemia. Continue statin therapy, per home regimen.   4. Gastric esophageal reflux disease. Patient was placed on antiacid therapy, tolerating well. Continue antiemetics.   Will change to inpatient status admission.   DVT prophylaxis: enoxaparin  Code Status: full  Family Communication: No family at the bedside  Disposition Plan: home   Consultants:   Cardiology   Procedures:   Antimicrobials: Subjective: Patient had chest pain last night precordial on the left, radiated to the back, moderate in intensity. Improved with nitroglycerin.   Objective: Vitals:   10/26/16 1130 10/26/16 1132 10/26/16 1946 10/27/16 0501  BP: (!) 172/95 (!) 171/96 (!) 141/77 (!) 163/86  Pulse:   (!) 55 61  Resp:   20 20  Temp:   98 F (36.7 C) 97.4 F (36.3 C)  TempSrc:   Oral Oral  SpO2:   100% 100%  Weight:      Height:       No intake or output data in the 24 hours ending 10/27/16 1253 Filed Weights   10/25/16 1623 10/25/16 2048  Weight: 86.2 kg (190 lb) 84.9 kg (187 lb 3.2 oz)    Examination:  General exam: not in pain E ENT: no pallor or icterus. Oral mucosa moist.  Respiratory system: Clear to auscultation. Respiratory effort normal. No wheezing, rales or rhonchi.  Cardiovascular system: S1 & S2 heard, RRR. No JVD, murmurs, rubs, gallops or clicks. No pedal edema. Gastrointestinal system: Abdomen is nondistended, soft and nontender. No organomegaly or masses felt. Normal bowel sounds heard. Central nervous system: Alert and oriented. No focal neurological deficits. Extremities: Symmetric 5 x 5 power. Skin: No rashes, lesions or ulcers  Data Reviewed: I have personally reviewed following labs and imaging studies  CBC:  Recent Labs Lab 10/25/16 1636 10/27/16 0239  WBC 8.0 5.5  NEUTROABS  --  3.0  HGB 15.2 13.8  HCT 43.8 42.2  MCV 86.9 88.8  PLT 177 184   Basic  Metabolic Panel:  Recent Labs Lab 10/25/16 1636 10/27/16 0239  NA 138 142  K 3.5 3.7  CL 103 109  CO2 24 29  GLUCOSE 128* 97  BUN 14 14  CREATININE 0.79 0.75  CALCIUM 9.5 9.1   GFR: Estimated Creatinine Clearance: 105.1 mL/min (by C-G formula based on SCr of 0.75 mg/dL). Liver Function Tests:  Recent Labs Lab 10/25/16 1624  AST 25  ALT 20  ALKPHOS 54  BILITOT 1.4*  PROT 7.0  ALBUMIN 4.3   No results for input(s): LIPASE, AMYLASE in the last 168 hours. No results for input(s): AMMONIA in the last 168 hours. Coagulation Profile:  Recent Labs Lab 10/25/16 1626  INR 1.07   Cardiac Enzymes:  Recent Labs Lab 10/25/16 1942 10/26/16 0232 10/26/16 0736  TROPONINI <0.03 <0.03 <0.03   BNP (last 3 results) No results for input(s): PROBNP in the last 8760 hours. HbA1C: No results for input(s): HGBA1C in the last 72 hours. CBG: No results for input(s): GLUCAP in the last 168 hours. Lipid Profile:  Recent Labs  10/26/16 0232  CHOL 143  HDL 54  LDLCALC 81  TRIG 41  CHOLHDL 2.6   Thyroid Function Tests: No results for input(s): TSH, T4TOTAL, FREET4, T3FREE, THYROIDAB in the last 72 hours. Anemia Panel: No results for input(s): VITAMINB12, FOLATE, FERRITIN, TIBC, IRON, RETICCTPCT in the last 72 hours. Sepsis Labs: No results for input(s): PROCALCITON, LATICACIDVEN in the last 168 hours.  No results found for this or any previous visit (from the past 240 hour(s)).       Radiology Studies: Nm Myocar Multi W/spect W/wall Motion / Ef  Result Date: 10/26/2016 CLINICAL DATA:  Chest pain and history of coronary artery disease with prior LAD stent placement. EXAM: MYOCARDIAL IMAGING WITH SPECT (REST AND PHARMACOLOGIC-STRESS) GATED LEFT VENTRICULAR WALL MOTION STUDY LEFT VENTRICULAR EJECTION FRACTION TECHNIQUE: Standard myocardial SPECT imaging was performed after resting intravenous injection of 10 mCi Tc-22m tetrofosmin. Subsequently, intravenous infusion of  Lexiscan was performed under the supervision of the Cardiology staff. At peak effect of the drug, 30 mCi Tc-98m tetrofosmin was injected intravenously and standard myocardial SPECT imaging was performed. Quantitative gated imaging was also performed to evaluate left ventricular wall motion, and estimate left ventricular ejection fraction. COMPARISON:  None. FINDINGS: Perfusion: There is evidence of a fixed perfusion defect involving the basilar aspect of the inferior wall. Inducible ischemia is identified in the adjacent mid to distal inferolateral wall. Wall Motion: Normal left ventricular wall motion. No left ventricular dilation. Left Ventricular Ejection Fraction: 57 % End diastolic volume 122 ml End systolic volume 52 ml IMPRESSION: 1. Evidence of inducible ischemia involving the mid to distal inferolateral wall. There appears to be a component of scar involving the basilar aspect of the inferior wall. 2. Normal left ventricular wall motion. 3. Left ventricular ejection fraction 57% 4. Non invasive risk stratification*: Intermediate *2012 Appropriate Use Criteria for Coronary Revascularization Focused Update: J Am Coll Cardiol. 2012;59(9):857-881. http://content.dementiazones.com.aspx?articleid=[email or web address] Electronically Signed   By: Irish Lack M.D.   On: 10/26/2016 13:43   Dg Chest Portable 1 View  Result Date: 10/25/2016 CLINICAL DATA:  Left-sided chest and neck pain starting this morning EXAM: PORTABLE CHEST 1  VIEW COMPARISON:  None. FINDINGS: The heart size and mediastinal contours are within normal limits. Slightly low lung volumes. Minimal atelectasis noted at the right lung base. No pulmonary consolidation, effusion or pneumothorax. No overt pulmonary edema. No acute nor suspicious osseous abnormalities. IMPRESSION: No active disease.  Slightly low lung volumes. Electronically Signed   By: Tollie Eth M.D.   On: 10/25/2016 17:49        Scheduled Meds: . [START ON 10/28/2016] aspirin  81 mg  Oral Pre-Cath  . aspirin EC  81 mg Oral Daily  . atorvastatin  40 mg Oral Daily  . clopidogrel  75 mg Oral Daily  . famotidine (PEPCID) IV  20 mg Intravenous Q12H  . lisinopril  40 mg Oral Daily  . metoprolol succinate  25 mg Oral Daily  . pantoprazole  40 mg Oral Daily  . sodium chloride flush  3 mL Intravenous Q12H   Continuous Infusions: . [START ON 10/28/2016] sodium chloride     Followed by  . [START ON 10/28/2016] sodium chloride    . heparin 1,150 Units/hr (10/27/16 1200)  . nitroGLYCERIN       LOS: 0 days       Coralie Keens, MD [facility] Pager [phone]  If 7PM-7AM, please contact night-coverage [email or web address] Password [facility] 10/27/2016, 12:53 PM

## 2016-10-27 NOTE — Progress Notes (Signed)
 Progress Note  Patient Name: Ricky Hughes Date of Encounter: 10/27/2016  Primary Cardiologist: [place],  N[place]ser in [place]  Subjective   60 yo admitted with CP  Hx of CAD myoview 10/26/16 shows a previous inf. MI with peri-infarct ischemia .  troponins have remained negative   Had some CP last night. Received SL NTG x 2   Inpatient Medications    Scheduled Meds: . [START ON 10/28/2016] aspirin  81 mg Oral Pre-Cath  . aspirin EC  81 mg Oral Daily  . atorvastatin  40 mg Oral Daily  . clopidogrel  75 mg Oral Daily  . enoxaparin (LOVENOX) injection  40 mg Subcutaneous Q24H  . famotidine (PEPCID) IV  20 mg Intravenous Q12H  . lisinopril  40 mg Oral Daily  . metoprolol succinate  25 mg Oral Daily  . pantoprazole  40 mg Oral Daily  . sodium chloride flush  3 mL Intravenous Q12H   Continuous Infusions: . [START ON 10/28/2016] sodium chloride     Followed by  . [START ON 10/28/2016] sodium chloride     PRN Meds: sodium chloride, acetaminophen, gi cocktail, morphine injection, ondansetron (ZOFRAN) IV, sodium chloride flush   Vital Signs    Vitals:   10/26/16 1130 10/26/16 1132 10/26/16 1946 10/27/16 0501  BP: (!) 172/95 (!) 171/96 (!) 141/77 (!) 163/86  Pulse:   (!) 55 61  Resp:   20 20  Temp:   98 F (36.7 C) 97.4 F (36.3 C)  TempSrc:   Oral Oral  SpO2:   100% 100%  Weight:      Height:       No intake or output data in the 24 hours ending 10/27/16 1018 Filed Weights   10/25/16 1623 10/25/16 2048  Weight: 190 lb (86.2 kg) 187 lb 3.2 oz (84.9 kg)    Telemetry    NSR  - Personally Reviewed  ECG    NSR  - Personally Reviewed  Physical Exam   GEN: No acute distress.   Neck: No JVD Cardiac: RRR, no murmurs, rubs, or gallops.  Respiratory: Clear to auscultation bilaterally. GI: Soft, nontender, non-distended  MS: No edema; No deformity. Neuro:  Nonfocal  Psych: Normal affect   Labs    Chemistry Recent Labs Lab 10/25/16 1624 10/25/16 1636  10/27/16 0239  NA  --  138 142  K  --  3.5 3.7  CL  --  103 109  CO2  --  24 29  GLUCOSE  --  128* 97  BUN  --  14 14  CREATININE  --  0.79 0.75  CALCIUM  --  9.5 9.1  PROT 7.0  --   --   ALBUMIN 4.3  --   --   AST 25  --   --   ALT 20  --   --   ALKPHOS 54  --   --   BILITOT 1.4*  --   --   GFRNONAA  --  >60 >60  GFRAA  --  >60 >60  ANIONGAP  --  11 4*     Hematology Recent Labs Lab 10/25/16 1636 10/27/16 0239  WBC 8.0 5.5  RBC 5.04 4.75  HGB 15.2 13.8  HCT 43.8 42.2  MCV 86.9 88.8  MCH 30.2 29.1  MCHC 34.7 32.7  RDW 13.8 14.1  PLT 177 184    Cardiac Enzymes Recent Labs Lab 10/25/16 1942 10/26/16 0232 10/26/16 0736  TROPONINI <0.03 <0.03 <0.03    Recent   Labs Lab 10/25/16 1642  TROPIPOC 0.00     BNPNo results for input(s): BNP, PROBNP in the last 168 hours.   DDimer No results for input(s): DDIMER in the last 168 hours.   Radiology    Nm Myocar Multi W/spect W/wall Motion / Ef  Result Date: 10/26/2016 CLINICAL DATA:  Chest pain and history of coronary artery disease with prior LAD stent placement. EXAM: MYOCARDIAL IMAGING WITH SPECT (REST AND PHARMACOLOGIC-STRESS) GATED LEFT VENTRICULAR WALL MOTION STUDY LEFT VENTRICULAR EJECTION FRACTION TECHNIQUE: Standard myocardial SPECT imaging was performed after resting intravenous injection of 10 mCi Tc-99m tetrofosmin. Subsequently, intravenous infusion of Lexiscan was performed under the supervision of the Cardiology staff. At peak effect of the drug, 30 mCi Tc-99m tetrofosmin was injected intravenously and standard myocardial SPECT imaging was performed. Quantitative gated imaging was also performed to evaluate left ventricular wall motion, and estimate left ventricular ejection fraction. COMPARISON:  None. FINDINGS: Perfusion: There is evidence of a fixed perfusion defect involving the basilar aspect of the inferior wall. Inducible ischemia is identified in the adjacent mid to distal inferolateral wall. Wall  Motion: Normal left ventricular wall motion. No left ventricular dilation. Left Ventricular Ejection Fraction: 57 % End diastolic volume 122 ml End systolic volume 52 ml IMPRESSION: 1. Evidence of inducible ischemia involving the mid to distal inferolateral wall. There appears to be a component of scar involving the basilar aspect of the inferior wall. 2. Normal left ventricular wall motion. 3. Left ventricular ejection fraction 57% 4. Non invasive risk stratification*: Intermediate *2012 Appropriate Use Criteria for Coronary Revascularization Focused Update: J Am Coll Cardiol. 2012;59(9):857-881. http://content.onlinejacc.org/article.aspx?articleid=[email or web address] Electronically Signed   By: Glenn  Yamagata M.D.   On: 10/26/2016 13:43   Dg Chest Portable 1 View  Result Date: 10/25/2016 CLINICAL DATA:  Left-sided chest and neck pain starting this morning EXAM: PORTABLE CHEST 1 VIEW COMPARISON:  None. FINDINGS: The heart size and mediastinal contours are within normal limits. Slightly low lung volumes. Minimal atelectasis noted at the right lung base. No pulmonary consolidation, effusion or pneumothorax. No overt pulmonary edema. No acute nor suspicious osseous abnormalities. IMPRESSION: No active disease.  Slightly low lung volumes. Electronically Signed   By: David  Kwon M.D.   On: 10/25/2016 17:49    Cardiac Studies      Patient Profile     60 y.o. male with hx of CAD myoview shows a previous INf. MI with peri-infarct ischemia Had recurrent CP last night   Assessment & Plan    1. CAD :    myoview shows a previous INf. MI with peri-infarct ischemia Had recurrent CP last night Will plan on cath tomorrow. I have discussed risks, benefits, options.  He understands and agrees to proceed.   Will start IV NTG and heparin since he had CP last night   Signed, Philip Nahser, MD  10/27/2016, 10:18 AM    

## 2016-10-27 NOTE — Progress Notes (Signed)
ANTICOAGULATION CONSULT NOTE - Initial Consult  Pharmacy Consult for Heparin Indication: chest pain/ACS  No Known Allergies  Patient Measurements: Height: 6' (182.9 cm) Weight: 187 lb 3.2 oz (84.9 kg) IBW/kg (Calculated) : 77.6 Heparin Dosing Weight:  84.9 kg  Vital Signs: Temp: 97.4 F (36.3 C) (03/04 0501) Temp Source: Oral (03/04 0501) BP: 163/86 (03/04 0501) Pulse Rate: 61 (03/04 0501)  Labs:  Recent Labs  10/25/16 1626 10/25/16 1636 10/25/16 1942 10/26/16 0232 10/26/16 0736 10/27/16 0239  HGB  --  15.2  --   --   --  13.8  HCT  --  43.8  --   --   --  42.2  PLT  --  177  --   --   --  184  LABPROT 13.9  --   --   --   --   --   INR 1.07  --   --   --   --   --   CREATININE  --  0.79  --   --   --  0.75  TROPONINI  --   --  <0.03 <0.03 <0.03  --     Estimated Creatinine Clearance: 105.1 mL/min (by C-G formula based on SCr of 0.75 mg/dL).   Medical History: Past Medical History:  Diagnosis Date  . Coronary artery disease    s/p PCI to LAD 2010; Cath 04/20/12  revealed single vessel CAD w/ patent stent in mid LAD & mod non flow limiting disease beyond the stented segment, mild non-obstructive dz in LCx and RCA, nl LV systolic fxn, no evidence of aortic dissection  . GERD (gastroesophageal reflux disease)   . Headache(784.0)   . Hypertension   . Ulcer [facility](HCC)     Assessment: 61 y/o M admitted with CP with known CAD. myoview shows a previous inferior MI with peri-infarct ischemia Had recurrent CP last night 3/3.  PMH: CAD, LAD stent, PUD, HTN, HLD  Significant events: Patient ruled out for acute cardiac ischemia with serial cardiac enzymes. Patient was seen by cardiology and he underwent a nuclear stress test, reported as intermediate, evidence of inducible ischemia involving the mid to distal inferior lateral wall.   Anticoag: Start IV heparin for recurrent CP.   Goal of Therapy:  Heparin level 0.3-0.7 units/ml Monitor platelets by anticoagulation  protocol: Yes   Plan:  Cath Monday Heparin 4000 unit bolus, infusion 1150 units/hr Heparin level in 6-8 hrs Daily HL and CBC   Beatris Belen S. Merilynn Finlandobertson, PharmD, BCPS Clinical Staff Pharmacist Pager [phone]  Misty Stanleyobertson, Garlon Tuggle Stillinger 10/27/2016,10:43 AM

## 2016-10-27 NOTE — Progress Notes (Signed)
ANTICOAGULATION CONSULT NOTE - Initial Consult  Pharmacy Consult for Heparin Indication: chest pain/ACS  No Known Allergies  Patient Measurements: Height: 6' (182.9 cm) Weight: 187 lb 3.2 oz (84.9 kg) IBW/kg (Calculated) : 77.6 Heparin Dosing Weight:  84.9 kg  Vital Signs:    Labs:  Recent Labs  10/25/16 1626 10/25/16 1636 10/25/16 1942 10/26/16 0232 10/26/16 0736 10/27/16 0239 10/27/16 1758  HGB  --  15.2  --   --   --  13.8  --   HCT  --  43.8  --   --   --  42.2  --   PLT  --  177  --   --   --  184  --   LABPROT 13.9  --   --   --   --   --   --   INR 1.07  --   --   --   --   --   --   HEPARINUNFRC  --   --   --   --   --   --  0.52  CREATININE  --  0.79  --   --   --  0.75  --   TROPONINI  --   --  <0.03 <0.03 <0.03  --   --     Estimated Creatinine Clearance: 105.1 mL/min (by C-G formula based on SCr of 0.75 mg/dL).   Medical History: Past Medical History:  Diagnosis Date  . Coronary artery disease    s/p PCI to LAD 2010; Cath 04/20/12  revealed single vessel CAD w/ patent stent in mid LAD & mod non flow limiting disease beyond the stented segment, mild non-obstructive dz in LCx and RCA, nl LV systolic fxn, no evidence of aortic dissection  . GERD (gastroesophageal reflux disease)   . Headache(784.0)   . Hypertension   . Ulcer [facility](HCC)     Assessment: 61 y/o M admitted with CP with known CAD. myoview shows a previous inferior MI with peri-infarct ischemia Had recurrent CP last night 3/3.  PMH: CAD, LAD stent, PUD, HTN, HLD  Significant events: Patient ruled out for acute cardiac ischemia with serial cardiac enzymes. Patient was seen by cardiology and he underwent a nuclear stress test, reported as intermediate, evidence of inducible ischemia involving the mid to distal inferior lateral wall.   Anticoag: Start IV heparin for recurrent CP. Initial hep lvl with goal  Goal of Therapy:  Heparin level 0.3-0.7 units/ml Monitor platelets by anticoagulation  protocol: Yes   Plan:  Heparin infusion 1150 units/hr Daily HL and CBC  Isaac BlissMichael Alyxander Kollmann, PharmD, BCPS, BCCCP Clinical Pharmacist 10/27/2016 6:56 PM

## 2016-10-28 ENCOUNTER — Encounter (HOSPITAL_COMMUNITY): Payer: Self-pay | Admitting: Cardiology

## 2016-10-28 ENCOUNTER — Encounter (HOSPITAL_COMMUNITY)
Admission: EM | Disposition: A | Discharge status: Home / Self Care | Payer: Self-pay | Source: Home / Self Care | Attending: Internal Medicine

## 2016-10-28 ENCOUNTER — Other Ambulatory Visit: Payer: Self-pay | Admitting: *Deleted

## 2016-10-28 DIAGNOSIS — I2510 Atherosclerotic heart disease of native coronary artery without angina pectoris: Secondary | ICD-10-CM

## 2016-10-28 DIAGNOSIS — I10 Essential (primary) hypertension: Secondary | ICD-10-CM

## 2016-10-28 DIAGNOSIS — I25110 Atherosclerotic heart disease of native coronary artery with unstable angina pectoris: Secondary | ICD-10-CM

## 2016-10-28 DIAGNOSIS — R9439 Abnormal result of other cardiovascular function study: Secondary | ICD-10-CM

## 2016-10-28 HISTORY — PX: LEFT HEART CATH AND CORONARY ANGIOGRAPHY: CATH118249

## 2016-10-28 LAB — CBC
HCT: 41.7 % (ref 39.0–52.0)
Hemoglobin: 13.4 g/dL (ref 13.0–17.0)
MCH: 28.6 pg (ref 26.0–34.0)
MCHC: 32.1 g/dL (ref 30.0–36.0)
MCV: 89.1 fL (ref 78.0–100.0)
Platelets: 166 10*3/uL (ref 150–400)
RBC: 4.68 MIL/uL (ref 4.22–5.81)
RDW: 14.1 % (ref 11.5–15.5)
WBC: 5.8 10*3/uL (ref 4.0–10.5)

## 2016-10-28 LAB — BASIC METABOLIC PANEL WITH GFR
Anion gap: 3 — ABNORMAL LOW (ref 5–15)
BUN: 16 mg/dL (ref 6–20)
CO2: 29 mmol/L (ref 22–32)
Calcium: 9.1 mg/dL (ref 8.9–10.3)
Chloride: 109 mmol/L (ref 101–111)
Creatinine, Ser: 0.91 mg/dL (ref 0.61–1.24)
GFR calc Af Amer: >60 mL/min
GFR calc non Af Amer: >60 mL/min
Glucose, Bld: 107 mg/dL — ABNORMAL HIGH (ref 65–99)
Potassium: 3.8 mmol/L (ref 3.5–5.1)
Sodium: 141 mmol/L (ref 135–145)

## 2016-10-28 LAB — HEPARIN LEVEL (UNFRACTIONATED): Heparin Unfractionated: 0.61 [IU]/mL (ref 0.30–0.70)

## 2016-10-28 SURGERY — LEFT HEART CATH AND CORONARY ANGIOGRAPHY
Anesthesia: LOCAL

## 2016-10-28 MED ORDER — HEPARIN (PORCINE) IN NACL 2-0.9 UNIT/ML-% IJ SOLN
INTRAMUSCULAR | Status: DC | PRN
  Administered 2016-10-28: 1000 mL

## 2016-10-28 MED ORDER — IOPAMIDOL (ISOVUE-370) INJECTION 76%
INTRAVENOUS | Status: DC | PRN
  Administered 2016-10-28: 85 mL via INTRA_ARTERIAL

## 2016-10-28 MED ORDER — VERAPAMIL HCL 2.5 MG/ML IV SOLN
INTRAVENOUS | Status: AC
  Filled 2016-10-28: qty 2

## 2016-10-28 MED ORDER — LIDOCAINE HCL (PF) 1 % IJ SOLN
INTRAMUSCULAR | Status: AC
  Filled 2016-10-28: qty 30

## 2016-10-28 MED ORDER — MIDAZOLAM HCL 2 MG/2ML IJ SOLN
INTRAMUSCULAR | Status: DC | PRN
  Administered 2016-10-28: 2 mg via INTRAVENOUS

## 2016-10-28 MED ORDER — IOPAMIDOL (ISOVUE-370) INJECTION 76%
INTRAVENOUS | Status: AC
  Filled 2016-10-28: qty 100

## 2016-10-28 MED ORDER — HEPARIN SODIUM (PORCINE) 1000 UNIT/ML IJ SOLN
INTRAMUSCULAR | Status: DC | PRN
  Administered 2016-10-28: 5000 [IU] via INTRAVENOUS

## 2016-10-28 MED ORDER — HEPARIN (PORCINE) IN NACL 2-0.9 UNIT/ML-% IJ SOLN
INTRAMUSCULAR | Status: DC | PRN
  Administered 2016-10-28: 10 mL via INTRA_ARTERIAL

## 2016-10-28 MED ORDER — SODIUM CHLORIDE 0.9 % IV SOLN: 250.0000 mL | INTRAVENOUS | Status: DC | PRN

## 2016-10-28 MED ORDER — HEPARIN (PORCINE) IN NACL 100-0.45 UNIT/ML-% IJ SOLN
1250.0000 [IU]/h | INTRAMUSCULAR | Status: DC
  Administered 2016-10-28: 1150 [IU]/h via INTRAVENOUS
  Administered 2016-10-29 – 2016-10-31 (×3): 1250 [IU]/h via INTRAVENOUS
  Filled 2016-10-28 (×5): qty 250

## 2016-10-28 MED ORDER — SODIUM CHLORIDE 0.9 % IV SOLN
INTRAVENOUS | Status: AC
  Administered 2016-10-28: 11:00:00 via INTRAVENOUS

## 2016-10-28 MED ORDER — FENTANYL CITRATE (PF) 100 MCG/2ML IJ SOLN
INTRAMUSCULAR | Status: DC | PRN
  Administered 2016-10-28: 50 ug via INTRAVENOUS

## 2016-10-28 MED ORDER — HEPARIN (PORCINE) IN NACL 2-0.9 UNIT/ML-% IJ SOLN
INTRAMUSCULAR | Status: AC
  Filled 2016-10-28: qty 1000

## 2016-10-28 MED ORDER — HEPARIN SODIUM (PORCINE) 1000 UNIT/ML IJ SOLN
INTRAMUSCULAR | Status: AC
  Filled 2016-10-28: qty 1

## 2016-10-28 MED ORDER — SODIUM CHLORIDE 0.9% FLUSH
3.0000 mL | Freq: Two times a day (BID) | INTRAVENOUS | Status: DC
  Administered 2016-10-28 – 2016-10-31 (×5): 3 mL via INTRAVENOUS

## 2016-10-28 MED ORDER — SODIUM CHLORIDE 0.9% FLUSH
3.0000 mL | INTRAVENOUS | Status: DC | PRN
  Administered 2016-10-30: 3 mL via INTRAVENOUS
  Filled 2016-10-28: qty 3

## 2016-10-28 MED ORDER — LIDOCAINE HCL (PF) 1 % IJ SOLN
INTRAMUSCULAR | Status: DC | PRN
  Administered 2016-10-28: 2 mL

## 2016-10-28 MED ORDER — FENTANYL CITRATE (PF) 100 MCG/2ML IJ SOLN
INTRAMUSCULAR | Status: AC
  Filled 2016-10-28: qty 2

## 2016-10-28 MED ORDER — MIDAZOLAM HCL 2 MG/2ML IJ SOLN
INTRAMUSCULAR | Status: AC
  Filled 2016-10-28: qty 2

## 2016-10-28 SURGICAL SUPPLY — 11 items
CATH EXPO 5FR FR4 (CATHETERS) ×2 IMPLANT
CATH INFINITI 5 FR JL3.5 (CATHETERS) ×2 IMPLANT
CATH OPTITORQUE TIG 4.0 5F (CATHETERS) ×2 IMPLANT
DEVICE RAD COMP TR BAND LRG (VASCULAR PRODUCTS) ×2 IMPLANT
GLIDESHEATH SLEND A-KIT 6F 22G (SHEATH) ×2 IMPLANT
GUIDEWIRE INQWIRE 1.5J.035X260 (WIRE) ×1 IMPLANT
INQWIRE 1.5J .035X260CM (WIRE) ×2
KIT HEART LEFT (KITS) ×2 IMPLANT
PACK CARDIAC CATHETERIZATION (CUSTOM PROCEDURE TRAY) ×2 IMPLANT
TRANSDUCER W/STOPCOCK (MISCELLANEOUS) ×2 IMPLANT
TUBING CIL FLEX 10 FLL-RA (TUBING) ×2 IMPLANT

## 2016-10-28 NOTE — Consult Note (Signed)
[address]       [place] [address]             [phone]      Cardiothoracic Surgery Consultation  Reason for Consult: severe multivessel coronary artery dsease. Referring Physician: Dr. Glenetta Hew  Ricky Hughes is an 61 y.o. male.  HPI:   The patient is a 61 year old gentleman with hypertension and coronary disease s/p PCI with stenting of the LAD in 2010. He was cathed in 2013 showing a patent stent in the LAD with 50-60% stenosis beyond the stent, 20-30% OM stenosis and mild RCA disease. He was managed medically. He reports a 1-2 year history of exertional fatigue and shortness of breath, left chest pressure type pain radiating to back. The symptoms were occasional at first but now occurring more frequently and more severe. He has had some pain at rest. He has been taking some NTG at home and trying to ignore it. He was at work and developed pain in the left chest and neck, felt weak in legs and sat down. He had some associated N/V. He laid on the floor and his coworkers brought him to the ER. He ruled out for MI but had a myoview on 10/26/2016 showing an old IMI with peri-infarct ischemia. He had some more CP since admission and underwent cath this am showing patent LAD stent with 90% mid LAD stenosis just beyond the stent. The RCA is occluded with left to right collat to PDA and PL. LVEF normal with mild MR. LVEDP moderately elevated at 25.  Past Medical History:  Diagnosis Date  . Coronary artery disease    s/p PCI to LAD 2010; Cath 04/20/12  revealed single vessel CAD w/ patent stent in mid LAD & mod non flow limiting disease beyond the stented segment, mild non-obstructive dz in LCx and RCA, nl LV systolic fxn, no evidence of aortic dissection  . GERD (gastroesophageal reflux disease)   . Headache(784.0)   . Hypertension   . Ulcer [facility])     Past Surgical History:  Procedure Laterality Date  . CARDIAC CATHETERIZATION    . CERVICAL SPINE SURGERY      . CHOLECYSTECTOMY    . CORONARY ANGIOPLASTY WITH STENT PLACEMENT    . LEFT HEART CATH AND CORONARY ANGIOGRAPHY N/A 10/28/2016   Procedure: Left Heart Cath and Coronary Angiography;  Surgeon: Leonie Man, MD;  Location: [facility];  Service: Cardiovascular;  Laterality: N/A;  . LEFT HEART CATHETERIZATION WITH CORONARY ANGIOGRAM N/A 04/20/2012   Procedure: LEFT HEART CATHETERIZATION WITH CORONARY ANGIOGRAM;  Surgeon: Burnell Blanks, MD;  Location: [facility];  Service: Cardiovascular;  Laterality: N/A;    Family History  Problem Relation Age of Onset  . Heart attack Mother 50    Deid age 46  . Heart attack Father 56  . CAD Brother 8    CABG    Social History:  reports that he has never smoked. He has never used smokeless tobacco. He reports that he drinks alcohol. He reports that he does not use drugs.  Allergies: No Known Allergies  Medications:  Continuous: . sodium chloride 100 mL/hr at 10/28/16 1030  . nitroGLYCERIN Stopped (10/27/16 1100)   VCB:SWHQPR chloride, acetaminophen, gi cocktail, morphine injection, ondansetron (ZOFRAN) IV, sodium chloride flush Anti-infectives    None      Results for orders placed or performed during the hospital encounter of 10/25/16 (from the past 48  hour(s))  Basic metabolic panel     Status: Abnormal   Collection Time: 10/27/16  2:39 AM  Result Value Ref Range   Sodium 142 135 - 145 mmol/L   Potassium 3.7 3.5 - 5.1 mmol/L   Chloride 109 101 - 111 mmol/L   CO2 29 22 - 32 mmol/L   Glucose, Bld 97 65 - 99 mg/dL   BUN 14 6 - 20 mg/dL   Creatinine, Ser 0.75 0.61 - 1.24 mg/dL   Calcium 9.1 8.9 - 10.3 mg/dL   GFR calc non Af Amer >60 >60 mL/min   GFR calc Af Amer >60 >60 mL/min    Comment: (NOTE) The eGFR has been calculated using the CKD EPI equation. This calculation has not been validated in all clinical situations. eGFR's persistently <60 mL/min signify possible Chronic Kidney Disease.    Anion gap 4 (L) 5 - 15   CBC with Differential/Platelet     Status: None   Collection Time: 10/27/16  2:39 AM  Result Value Ref Range   WBC 5.5 4.0 - 10.5 K/uL   RBC 4.75 4.22 - 5.81 MIL/uL   Hemoglobin 13.8 13.0 - 17.0 g/dL   HCT 42.2 39.0 - 52.0 %   MCV 88.8 78.0 - 100.0 fL   MCH 29.1 26.0 - 34.0 pg   MCHC 32.7 30.0 - 36.0 g/dL   RDW 14.1 11.5 - 15.5 %   Platelets 184 150 - 400 K/uL   Neutrophils Relative % 53 %   Neutro Abs 3.0 1.7 - 7.7 K/uL   Lymphocytes Relative 33 %   Lymphs Abs 1.8 0.7 - 4.0 K/uL   Monocytes Relative 9 %   Monocytes Absolute 0.5 0.1 - 1.0 K/uL   Eosinophils Relative 4 %   Eosinophils Absolute 0.2 0.0 - 0.7 K/uL   Basophils Relative 1 %   Basophils Absolute 0.1 0.0 - 0.1 K/uL  Heparin level (unfractionated)     Status: None   Collection Time: 10/27/16  5:58 PM  Result Value Ref Range   Heparin Unfractionated 0.52 0.30 - 0.70 IU/mL    Comment:        IF HEPARIN RESULTS ARE BELOW EXPECTED VALUES, AND PATIENT DOSAGE HAS BEEN CONFIRMED, SUGGEST FOLLOW UP TESTING OF ANTITHROMBIN III LEVELS.   Heparin level (unfractionated)     Status: None   Collection Time: 10/28/16  2:10 AM  Result Value Ref Range   Heparin Unfractionated 0.61 0.30 - 0.70 IU/mL    Comment:        IF HEPARIN RESULTS ARE BELOW EXPECTED VALUES, AND PATIENT DOSAGE HAS BEEN CONFIRMED, SUGGEST FOLLOW UP TESTING OF ANTITHROMBIN III LEVELS.   CBC     Status: None   Collection Time: 10/28/16  2:10 AM  Result Value Ref Range   WBC 5.8 4.0 - 10.5 K/uL   RBC 4.68 4.22 - 5.81 MIL/uL   Hemoglobin 13.4 13.0 - 17.0 g/dL   HCT 41.7 39.0 - 52.0 %   MCV 89.1 78.0 - 100.0 fL   MCH 28.6 26.0 - 34.0 pg   MCHC 32.1 30.0 - 36.0 g/dL   RDW 14.1 11.5 - 15.5 %   Platelets 166 150 - 400 K/uL  Basic metabolic panel     Status: Abnormal   Collection Time: 10/28/16  2:10 AM  Result Value Ref Range   Sodium 141 135 - 145 mmol/L   Potassium 3.8 3.5 - 5.1 mmol/L   Chloride 109 101 - 111 mmol/L   CO2 29 22 -  32 mmol/L    Glucose, Bld 107 (H) 65 - 99 mg/dL   BUN 16 6 - 20 mg/dL   Creatinine, Ser 0.91 0.61 - 1.24 mg/dL   Calcium 9.1 8.9 - 10.3 mg/dL   GFR calc non Af Amer >60 >60 mL/min   GFR calc Af Amer >60 >60 mL/min    Comment: (NOTE) The eGFR has been calculated using the CKD EPI equation. This calculation has not been validated in all clinical situations. eGFR's persistently <60 mL/min signify possible Chronic Kidney Disease.    Anion gap 3 (L) 5 - 15    Nm Myocar Multi W/spect W/wall Motion / Ef  Result Date: 10/26/2016 CLINICAL DATA:  Chest pain and history of coronary artery disease with prior LAD stent placement. EXAM: MYOCARDIAL IMAGING WITH SPECT (REST AND PHARMACOLOGIC-STRESS) GATED LEFT VENTRICULAR WALL MOTION STUDY LEFT VENTRICULAR EJECTION FRACTION TECHNIQUE: Standard myocardial SPECT imaging was performed after resting intravenous injection of 10 mCi Tc-70mtetrofosmin. Subsequently, intravenous infusion of Lexiscan was performed under the supervision of the Cardiology staff. At peak effect of the drug, 30 mCi Tc-979metrofosmin was injected intravenously and standard myocardial SPECT imaging was performed. Quantitative gated imaging was also performed to evaluate left ventricular wall motion, and estimate left ventricular ejection fraction. COMPARISON:  None. FINDINGS: Perfusion: There is evidence of a fixed perfusion defect involving the basilar aspect of the inferior wall. Inducible ischemia is identified in the adjacent mid to distal inferolateral wall. Wall Motion: Normal left ventricular wall motion. No left ventricular dilation. Left Ventricular Ejection Fraction: 57 % End diastolic volume 12258l End systolic volume 52 ml IMPRESSION: 1. Evidence of inducible ischemia involving the mid to distal inferolateral wall. There appears to be a component of scar involving the basilar aspect of the inferior wall. 2. Normal left ventricular wall motion. 3. Left ventricular ejection fraction 57% 4. Non  invasive risk stratification*: Intermediate *2012 Appropriate Use Criteria for Coronary Revascularization Focused Update: J Am Coll Cardiol. 205277;82(4):[email or web address] Electronically Signed   By: GlAletta Edouard.D.   On: 10/26/2016 13:43    Review of Systems  Constitutional: Positive for diaphoresis and malaise/fatigue. Negative for chills, fever and weight loss.  HENT: Negative.   Eyes: Negative.   Respiratory: Positive for shortness of breath. Negative for cough, hemoptysis and sputum production.   Cardiovascular: Positive for chest pain. Negative for palpitations, orthopnea, leg swelling and PND.  Gastrointestinal: Positive for heartburn, nausea and vomiting. Negative for abdominal pain, constipation and diarrhea.  Genitourinary: Negative.   Musculoskeletal: Negative.   Skin: Negative.   Neurological: Negative for dizziness and focal weakness.       Chronically decreased sensation in upper extremities following cervical spine surgery in past.  Endo/Heme/Allergies: Negative.   Psychiatric/Behavioral: Negative.    Blood pressure (!) 147/77, pulse (!) 56, temperature 97.7 F (36.5 C), temperature source Oral, resp. rate 16, height 6' (1.829 m), weight 84.5 kg (186 lb 3.2 oz), SpO2 98 %. Physical Exam  Constitutional: He is oriented to person, place, and time. He appears well-developed and well-nourished. No distress.  HENT:  Head: Normocephalic and atraumatic.  Mouth/Throat: Oropharynx is clear and moist.  Eyes: EOM are normal. Pupils are equal, round, and reactive to light.  Neck: Normal range of motion. Neck supple. No JVD present. No thyromegaly present.  Cardiovascular: Normal rate, regular rhythm, normal heart sounds and intact distal pulses.   No murmur heard. Respiratory: Effort normal and breath sounds normal. No respiratory distress.  GI: Soft. Bowel sounds  are normal. He exhibits no distension. There is no tenderness.    Musculoskeletal: Normal range of motion. He exhibits no edema.  Lymphadenopathy:    He has no cervical adenopathy.  Neurological: He is alert and oriented to person, place, and time. He has normal strength. No cranial nerve deficit or sensory deficit.  Skin: Skin is warm and dry.  Psychiatric: He has a normal mood and affect.     Ricky Hughes  Cardiac catheterization  Order# [id number]  Reading physician: Leonie Man, MD Ordering physician: Leonie Man, MD Study date: 10/28/16  Physicians   Panel Physicians Referring Physician Case Authorizing Physician  Leonie Man, MD (Primary)    Procedures   Left Heart Cath and Coronary Angiography  Conclusion     Prox RCA to Dist RCA lesion, 100 %stenosed. - The RPDA with retrograde flow into the posterior lateral branch fills via collaterals from the LAD. The RV marginal branch fills via collaterals from the distal LAD.  Mid LAD stent, 0 %stenosed.  Dist LAD lesion, 90 %stenosed - just beyond the stent  There is mild (2+) mitral regurgitation.  The left ventricular systolic function is normal. The left ventricular ejection fraction is 55-65% by visual estimate.  LV end diastolic pressure is moderately elevated.   Patient has severe 2 vessel disease involving the LAD that is the main conduit to the occluded RCA. Given his risk factors and history of proximal compliance, he is probably best served with bypass surgery. We will stop the Plavix. CT surgery was consulted.  Otherwise continue treatment cardiac risk factors.  Plan:  Return to nursing unit with TR band removal.  DC Plavix  Restart IV heparin 8 hours post cath  CT surgical consult as been placed   Glenetta Hew, M.D., M.S. Interventional Cardiologist   Pager # [phone] Phone # [phone] [address], [place] [address]    Indications   Coronary artery disease involving native coronary artery of native heart with  unstable angina pectoris (HCC) [I25.110 (ICD-10-CM)]  Abnormal nuclear stress test [K93.81 (ICD-10-CM)]  Procedural Details/Technique   Technical Details Primary Physician Yvone Neu, MD Primary Cardiologist None - Dr. Percival Spanish Chief Complaint Chest pain Requesting MD Dr. Tana Coast  61 year old gentleman with a history of prior coronary disease status post PCI to LAD in 2010. Last cath 2013 demonstrated a patent stent with 50 - 60% LAD stenosis beyond the stent. OM had 20-30% stenosis and mild plaque in the RCA. EF was 70%. He was managed medically.  He now presented to [facility] with symptoms concerning for unstable angina. He had a nuclear stress test performed which showed likely inferior infarct with peri-infarct ischemia. He is now referred for invasive evaluation with cardiac catheterization. The study was read as intermediate risk.  Time Out: Verified patient identification, verified procedure, site/side was marked, verified correct patient position, special equipment/implants available, medications/allergies/relevent history reviewed, required imaging and test results available. Performed. Consent Signed.   Access:  RIGHT Radial Artery: 6 Fr sheath -- Seldinger technique using Angiocath Micropuncture Kit 10 mL radial cocktail IA; 5000 Units IV Heparin  Left Heart Catheterization: 5Fr Catheters advanced or exchanged over a J-wire under direct fluoroscopic guidance into the ascending aorta; TIG 4.0 catheter advanced first.  Left Coronary Artery Cineangiography: JL4 Catheter  Right Coronary Artery Cineangiography: JR4 Catheter  LV Hemodynamics (LV Gram): JR4 Catheter   After reviewing the angiography, I felt the best option was be to consider CABG. I therefore  discontinued the procedure. The catheter was removed completely out of the body over wire, without complication  RADIAL Sheath(s) removed in the CATH LAB with TR Band placed for hemostasis.   TR Band: 0940  Hours; 14 mL air  MEDICATIONS * SQ Lidocaine 100m * Radial Cocktail: 3 mg verapamilin 10 mL NS * Isovue Contrast: 85 mL * Heparin: 5000 Units   Estimated blood loss <50 mL.  During this procedure the patient was administered the following to achieve and maintain moderate conscious sedation: Versed 2 mg, Fentanyl 50 mcg, while the patient's heart rate, blood pressure, and oxygen saturation were continuously monitored. The period of conscious sedation was 33 minutes, of which I was present face-to-face 100% of this time.    Coronary Findings   Dominance: Right  Left Main  Vessel is large.  Left Anterior Descending  Vessel is large.  Ost LAD to Mid LAD lesion, 0% stenosed. Previously placed Ost LAD to Mid LAD stent (unknown type) is widely patent. 2010  Dist LAD lesion, 90% stenosed. The lesion is distal to major branch, segmental and irregular. Just beyond the stent Tandem lesions with a small aneurysmal segment in between.  First Septal Branch  Vessel is moderate in size.  Second Septal Branch  Vessel is small in size.  Third Septal Branch  Vessel is small in size.  Left Circumflex  Vessel is large.  First Obtuse Marginal Branch  Vessel is moderate in size.  Ost 1st Mrg to 1st Mrg lesion, 30% stenosed. The lesion is located at the major branch.  Lateral First Obtuse Marginal Branch  Vessel is small in size.  Right Coronary Artery  Vessel is large.  Prox RCA to Dist RCA lesion, 100% stenosed. The lesion is chronically occluded with left-to-right collateral flow. With very faint filling antegrade  Acute Marginal Branch  Vessel is moderate in size. Acute Mrg filled by collaterals from Dist LAD.  Right Posterior Descending Artery  Vessel is moderate in size.  Inferior Septal  Vessel is small in size. Inf Sept filled by collaterals from 1st Sept.  Right Posterior Atrioventricular Branch  Vessel is large in size. Fills via retrograde flow from collaterals to the PDA  First Right  Posterolateral  Vessel is moderate in size.  Wall Motion              Left Heart   Left Ventricle The left ventricular size is normal. The left ventricular systolic function is normal. LV end diastolic pressure is moderately elevated. The left ventricular ejection fraction is 55-65% by visual estimate. No regional wall motion abnormalities. There is mild (2+) mitral regurgitation.    Aortic Valve There is no aortic valve stenosis.    Coronary Diagrams   Diagnostic Diagram     Implants     No implant documentation for this case.  PACS Images   Show images for Cardiac catheterization   Link to Procedure Log   Procedure Log    Hemo Data   Flowsheet Row Most Recent Value  AO Systolic Pressure 1253mmHg  AO Diastolic Pressure 81 mmHg  AO Mean 1664mmHg  LV Systolic Pressure 1403mmHg  LV Diastolic Pressure 9 mmHg  LV EDP 22 mmHg  Arterial Occlusion Pressure Extended Systolic Pressure 1474mmHg  Arterial Occlusion Pressure Extended Diastolic Pressure 86 mmHg  Arterial Occlusion Pressure Extended Mean Pressure 116 mmHg  Left Ventricular Apex Extended Systolic Pressure 1259mmHg  Left Ventricular Apex Extended Diastolic Pressure 13 mmHg  Left Ventricular Apex  Extended EDP Pressure 25 mmHg    Assessment/Plan:  The gentleman has severe 2-vessel coronary artery disease with unstable anginal symptoms. His LAD is 90% stenosed just beyond the prior stent and the RCA is occluded proximally with left to right collaterals to a large PDA and PL. I agree that CABG is the best treatment for this gentleman. I discussed the operative procedure with the patient including alternatives, benefits and risks; including but not limited to bleeding, blood transfusion, infection, stroke, myocardial infarction, graft failure, heart block requiring a permanent pacemaker, organ dysfunction, and death. His wife is not here at this time but I will discuss it with her later. Ricky Hughes understands and  agrees to proceed. He has been on Plavix and therefore I will wait until Friday for Plavix washout and will check P2Y12 on Thursday.  I spent 60 minutes performing this consultation and > 50% of this time was spent face to face counseling and coordinating the care of this patient's multi-vessel coronary artery disease.  Gaye Pollack 10/28/2016, 11:53 AM

## 2016-10-28 NOTE — Progress Notes (Signed)
PROGRESS NOTE    Ricky Hughes  ZOX:[id number] DOB: 04/18/1956 DOA: 10/25/2016 PCP: Maximiano CossHUNGARLAND,JOHN DAVID, MD    Brief Narrative:   This is a 61 year old male who presents to the hospital chief complaint of chest pain. He is known to have coronary disease status post stenting in the LAD. He developed acute left neck/left chest pain, severe in intensity, radiating to his back, constant, worsening over the course of the day. On initial physical examination blood pressure 157/89, heart rate 80, respiratory rate 19, saturation 97%. His lungs were clear to auscultation, heart S1-S2 present rhythmic, abdomen soft and nontender, lower extremities no edema.Sodium 138, potassium 3.5, chloride 103, bicarbonate 24, glucose 120, BUN 14, creatinine 0.79, less than troponin 0.03. White count 8.0, hemoglobin 15.2, hematocrit 43.8, platelets 177. Chest x-ray clear for infiltrates. EKG sinus rhythm with left atrial enlargement. No ST elevations or ST depressions, no significant T wave abnormalities. The patient was admitted to the hospital with the working diagnosis of atypical chest pain rule out for acute coronary syndrome. Stress tess with intermediate risk, plan for coronary angiography. Patient with diffuse CAD, referred for surgical evaluation.   Assessment & Plan:   Principal Problem:   Chest pain Active Problems:   Coronary atherosclerosis of native coronary artery   History of peptic ulcer disease   Hypertension   Hyperlipidemia   Unstable angina pectoris (HCC)   Abnormal nuclear stress test   1. Unstable angina pectoris (present on admission). No further chest pain, will continue heparin infusions. Continued on aspirin, statin, metoprolol, lisinopril. Holding clopidogrel due possible surgical intervention. Cardiac catheterization with multi vessel CAD (severe 2 vessel disease involving LAD in the main conduit to the occludded RCA). CT surgery consulted. As needed sl nitro.   2. Hypertension.  Blood pressure control with metoprolol, lisinopril. Systolic blood pressure 140 to 160.   3. Dyslipidemia. Statin therapy, with atorvastatin 40 mg daily.   4. Gastric esophageal reflux disease. Continue antiacid therapy,     DVT prophylaxis:enoxaparin  Code Status:full  Family Communication:No family at the bedside  Disposition Plan:home   Consultants:  Cardiology   Procedures: Cardiac catheterization 03/5.   Subjective: No further chest pain, no nausea or vomiting. Continue on heparin drip, waiting evaluation from CT surgery. Strong family history of CAD.   Objective: Vitals:   10/28/16 0926 10/28/16 0931 10/28/16 0936 10/28/16 0956  BP: (!) 156/91 (!) 140/95 (!) 161/105 (!) 147/77  Pulse: 65 67 (!) 0 (!) 56  Resp: 13 15 11 16   Temp:    97.7 F (36.5 C)  TempSrc:    Oral  SpO2: 94% 95% 97% 98%  Weight:      Height:       No intake or output data in the 24 hours ending 10/28/16 1202 Filed Weights   10/25/16 1623 10/25/16 2048 10/28/16 0451  Weight: 86.2 kg (190 lb) 84.9 kg (187 lb 3.2 oz) 84.5 kg (186 lb 3.2 oz)    Examination:  General exam: deconditioned E ENT: no pallor or icterus  Respiratory system: Clear to auscultation. Respiratory effort normal. No wheezing, rales or rhonchi.  Cardiovascular system: S1 & S2 heard, RRR. No JVD, murmurs, rubs, gallops or clicks. No pedal edema. Gastrointestinal system: Abdomen is nondistended, soft and nontender. No organomegaly or masses felt. Normal bowel sounds heard. Central nervous system: Alert and oriented. No focal neurological deficits. Extremities: Symmetric 5 x 5 power. Skin: No rashes, lesions or ulcers.    Data Reviewed: I have personally reviewed following  labs and imaging studies  CBC:  Recent Labs Lab 10/25/16 1636 10/27/16 0239 10/28/16 0210  WBC 8.0 5.5 5.8  NEUTROABS  --  3.0  --   HGB 15.2 13.8 13.4  HCT 43.8 42.2 41.7  MCV 86.9 88.8 89.1  PLT 177 184 166   Basic Metabolic  Panel:  Recent Labs Lab 10/25/16 1636 10/27/16 0239 10/28/16 0210  NA 138 142 141  K 3.5 3.7 3.8  CL 103 109 109  CO2 24 29 29   GLUCOSE 128* 97 107*  BUN 14 14 16   CREATININE 0.79 0.75 0.91  CALCIUM 9.5 9.1 9.1   GFR: Estimated Creatinine Clearance: 92.4 mL/min (by C-G formula based on SCr of 0.91 mg/dL). Liver Function Tests:  Recent Labs Lab 10/25/16 1624  AST 25  ALT 20  ALKPHOS 54  BILITOT 1.4*  PROT 7.0  ALBUMIN 4.3   No results for input(s): LIPASE, AMYLASE in the last 168 hours. No results for input(s): AMMONIA in the last 168 hours. Coagulation Profile:  Recent Labs Lab 10/25/16 1626  INR 1.07   Cardiac Enzymes:  Recent Labs Lab 10/25/16 1942 10/26/16 0232 10/26/16 0736  TROPONINI <0.03 <0.03 <0.03   BNP (last 3 results) No results for input(s): PROBNP in the last 8760 hours. HbA1C: No results for input(s): HGBA1C in the last 72 hours. CBG: No results for input(s): GLUCAP in the last 168 hours. Lipid Profile:  Recent Labs  10/26/16 0232  CHOL 143  HDL 54  LDLCALC 81  TRIG 41  CHOLHDL 2.6   Thyroid Function Tests: No results for input(s): TSH, T4TOTAL, FREET4, T3FREE, THYROIDAB in the last 72 hours. Anemia Panel: No results for input(s): VITAMINB12, FOLATE, FERRITIN, TIBC, IRON, RETICCTPCT in the last 72 hours. Sepsis Labs: No results for input(s): PROCALCITON, LATICACIDVEN in the last 168 hours.  No results found for this or any previous visit (from the past 240 hour(s)).       Radiology Studies: Nm Myocar Multi W/spect W/wall Motion / Ef  Result Date: 10/26/2016 CLINICAL DATA:  Chest pain and history of coronary artery disease with prior LAD stent placement. EXAM: MYOCARDIAL IMAGING WITH SPECT (REST AND PHARMACOLOGIC-STRESS) GATED LEFT VENTRICULAR WALL MOTION STUDY LEFT VENTRICULAR EJECTION FRACTION TECHNIQUE: Standard myocardial SPECT imaging was performed after resting intravenous injection of 10 mCi Tc-47m tetrofosmin.  Subsequently, intravenous infusion of Lexiscan was performed under the supervision of the Cardiology staff. At peak effect of the drug, 30 mCi Tc-50m tetrofosmin was injected intravenously and standard myocardial SPECT imaging was performed. Quantitative gated imaging was also performed to evaluate left ventricular wall motion, and estimate left ventricular ejection fraction. COMPARISON:  None. FINDINGS: Perfusion: There is evidence of a fixed perfusion defect involving the basilar aspect of the inferior wall. Inducible ischemia is identified in the adjacent mid to distal inferolateral wall. Wall Motion: Normal left ventricular wall motion. No left ventricular dilation. Left Ventricular Ejection Fraction: 57 % End diastolic volume 122 ml End systolic volume 52 ml IMPRESSION: 1. Evidence of inducible ischemia involving the mid to distal inferolateral wall. There appears to be a component of scar involving the basilar aspect of the inferior wall. 2. Normal left ventricular wall motion. 3. Left ventricular ejection fraction 57% 4. Non invasive risk stratification*: Intermediate *2012 Appropriate Use Criteria for Coronary Revascularization Focused Update: J Am Coll Cardiol. 2012;59(9):857-881. http://content.dementiazones.com.aspx?articleid=1201161 Electronically Signed   By: Irish Lack M.D.   On: 10/26/2016 13:43        Scheduled Meds: . aspirin EC  81 mg Oral Daily  . atorvastatin  40 mg Oral Daily  . famotidine (PEPCID) IV  20 mg Intravenous Q12H  . lisinopril  40 mg Oral Daily  . metoprolol succinate  25 mg Oral Daily  . pantoprazole  40 mg Oral Daily  . sodium chloride flush  3 mL Intravenous Q12H   Continuous Infusions: . sodium chloride 100 mL/hr at 10/28/16 1030  . nitroGLYCERIN Stopped (10/27/16 1100)     LOS: 1 day       Mauricio Annett Gula, MD [facility] Pager [phone]  If 7PM-7AM, please contact night-coverage [email or web address] Password [email or web address] 10/28/2016,  12:02 PM

## 2016-10-28 NOTE — Progress Notes (Signed)
   10/28/16 1305  Clinical Encounter Type  Visited With Patient and family together  Visit Type Other (Comment) ( consult)  Spiritual Encounters  Spiritual Needs Emotional  Stress Factors  Patient Stress Factors None identified  Family Stress Factors None identified  Introduction to Pt and family. Provided and explained advanced directive. Pt and family will look it over. They would prefer checking back tomorrow.

## 2016-10-28 NOTE — H&P (View-Only) (Signed)
Progress Note  Patient Name: Ricky Hughes Date of Encounter: 10/27/2016  Primary Cardiologist: Myrtha Mantis,  [facility] Sicard in [place]  Subjective   61 yo admitted with CP  Hx of CAD myoview 10/26/16 shows a previous inf. MI with peri-infarct ischemia .  troponins have remained negative   Had some CP last night. Received SL NTG x 2   Inpatient Medications    Scheduled Meds: . [START ON 10/28/2016] aspirin  81 mg Oral Pre-Cath  . aspirin EC  81 mg Oral Daily  . atorvastatin  40 mg Oral Daily  . clopidogrel  75 mg Oral Daily  . enoxaparin (LOVENOX) injection  40 mg Subcutaneous Q24H  . famotidine (PEPCID) IV  20 mg Intravenous Q12H  . lisinopril  40 mg Oral Daily  . metoprolol succinate  25 mg Oral Daily  . pantoprazole  40 mg Oral Daily  . sodium chloride flush  3 mL Intravenous Q12H   Continuous Infusions: . [START ON 10/28/2016] sodium chloride     Followed by  . [START ON 10/28/2016] sodium chloride     PRN Meds: sodium chloride, acetaminophen, gi cocktail, morphine injection, ondansetron (ZOFRAN) IV, sodium chloride flush   Vital Signs    Vitals:   10/26/16 1130 10/26/16 1132 10/26/16 1946 10/27/16 0501  BP: (!) 172/95 (!) 171/96 (!) 141/77 (!) 163/86  Pulse:   (!) 55 61  Resp:   20 20  Temp:   98 F (36.7 C) 97.4 F (36.3 C)  TempSrc:   Oral Oral  SpO2:   100% 100%  Weight:      Height:       No intake or output data in the 24 hours ending 10/27/16 1018 Filed Weights   10/25/16 1623 10/25/16 2048  Weight: 190 lb (86.2 kg) 187 lb 3.2 oz (84.9 kg)    Telemetry    NSR  - Personally Reviewed  ECG    NSR  - Personally Reviewed  Physical Exam   GEN: No acute distress.   Neck: No JVD Cardiac: RRR, no murmurs, rubs, or gallops.  Respiratory: Clear to auscultation bilaterally. GI: Soft, nontender, non-distended  MS: No edema; No deformity. Neuro:  Nonfocal  Psych: Normal affect   Labs    Chemistry Recent Labs Lab 10/25/16 1624 10/25/16 1636  10/27/16 0239  NA  --  138 142  K  --  3.5 3.7  CL  --  103 109  CO2  --  24 29  GLUCOSE  --  128* 97  BUN  --  14 14  CREATININE  --  0.79 0.75  CALCIUM  --  9.5 9.1  PROT 7.0  --   --   ALBUMIN 4.3  --   --   AST 25  --   --   ALT 20  --   --   ALKPHOS 54  --   --   BILITOT 1.4*  --   --   GFRNONAA  --  >60 >60  GFRAA  --  >60 >60  ANIONGAP  --  11 4*     Hematology Recent Labs Lab 10/25/16 1636 10/27/16 0239  WBC 8.0 5.5  RBC 5.04 4.75  HGB 15.2 13.8  HCT 43.8 42.2  MCV 86.9 88.8  MCH 30.2 29.1  MCHC 34.7 32.7  RDW 13.8 14.1  PLT 177 184    Cardiac Enzymes Recent Labs Lab 10/25/16 1942 10/26/16 0232 10/26/16 0736  TROPONINI <0.03 <0.03 <0.03    Recent  Labs Lab 10/25/16 1642  TROPIPOC 0.00     BNPNo results for input(s): BNP, PROBNP in the last 168 hours.   DDimer No results for input(s): DDIMER in the last 168 hours.   Radiology    Nm Myocar Multi W/spect W/wall Motion / Ef  Result Date: 10/26/2016 CLINICAL DATA:  Chest pain and history of coronary artery disease with prior LAD stent placement. EXAM: MYOCARDIAL IMAGING WITH SPECT (REST AND PHARMACOLOGIC-STRESS) GATED LEFT VENTRICULAR WALL MOTION STUDY LEFT VENTRICULAR EJECTION FRACTION TECHNIQUE: Standard myocardial SPECT imaging was performed after resting intravenous injection of 10 mCi Tc-4437m tetrofosmin. Subsequently, intravenous infusion of Lexiscan was performed under the supervision of the Cardiology staff. At peak effect of the drug, 30 mCi Tc-4737m tetrofosmin was injected intravenously and standard myocardial SPECT imaging was performed. Quantitative gated imaging was also performed to evaluate left ventricular wall motion, and estimate left ventricular ejection fraction. COMPARISON:  None. FINDINGS: Perfusion: There is evidence of a fixed perfusion defect involving the basilar aspect of the inferior wall. Inducible ischemia is identified in the adjacent mid to distal inferolateral wall. Wall  Motion: Normal left ventricular wall motion. No left ventricular dilation. Left Ventricular Ejection Fraction: 57 % End diastolic volume 122 ml End systolic volume 52 ml IMPRESSION: 1. Evidence of inducible ischemia involving the mid to distal inferolateral wall. There appears to be a component of scar involving the basilar aspect of the inferior wall. 2. Normal left ventricular wall motion. 3. Left ventricular ejection fraction 57% 4. Non invasive risk stratification*: Intermediate *2012 Appropriate Use Criteria for Coronary Revascularization Focused Update: J Am Coll Cardiol. 2012;59(9):857-881. http://content.dementiazones.comonlinejacc.org/article.aspx?articleid=1201161 Electronically Signed   By: Irish LackGlenn  Yamagata M.D.   On: 10/26/2016 13:43   Dg Chest Portable 1 View  Result Date: 10/25/2016 CLINICAL DATA:  Left-sided chest and neck pain starting this morning EXAM: PORTABLE CHEST 1 VIEW COMPARISON:  None. FINDINGS: The heart size and mediastinal contours are within normal limits. Slightly low lung volumes. Minimal atelectasis noted at the right lung base. No pulmonary consolidation, effusion or pneumothorax. No overt pulmonary edema. No acute nor suspicious osseous abnormalities. IMPRESSION: No active disease.  Slightly low lung volumes. Electronically Signed   By: Tollie Ethavid  Kwon M.D.   On: 10/25/2016 17:49    Cardiac Studies      Patient Profile     61 y.o. male with hx of CAD myoview shows a previous INf. MI with peri-infarct ischemia Had recurrent CP last night   Assessment & Plan    1. CAD :    myoview shows a previous INf. MI with peri-infarct ischemia Had recurrent CP last night Will plan on cath tomorrow. I have discussed risks, benefits, options.  He understands and agrees to proceed.   Will start IV NTG and heparin since he had CP last night   Signed, Kristeen MissPhilip Etana Beets, MD  10/27/2016, 10:18 AM

## 2016-10-28 NOTE — Progress Notes (Signed)
ANTICOAGULATION CONSULT NOTE - Follow Up Consult  Pharmacy Consult for Heparin Indication: chest pain/ACS, 2V CAD, awaiting CABG  No Known Allergies  Patient Measurements: Height: 6' (182.9 cm) Weight: 186 lb 3.2 oz (84.5 kg) IBW/kg (Calculated) : 77.6 Heparin Dosing Weight: 84.5 kg  Vital Signs: Temp: 97.7 F (36.5 C) (03/05 0956) Temp Source: Oral (03/05 0956) BP: 147/77 (03/05 0956) Pulse Rate: 56 (03/05 0956)  Labs:  Recent Labs  10/25/16 1626  10/25/16 1636 10/25/16 1942 10/26/16 0232 10/26/16 0736 10/27/16 0239 10/27/16 1758 10/28/16 0210  HGB  --   < > 15.2  --   --   --  13.8  --  13.4  HCT  --   --  43.8  --   --   --  42.2  --  41.7  PLT  --   --  177  --   --   --  184  --  166  LABPROT 13.9  --   --   --   --   --   --   --   --   INR 1.07  --   --   --   --   --   --   --   --   HEPARINUNFRC  --   --   --   --   --   --   --  0.52 0.61  CREATININE  --   --  0.79  --   --   --  0.75  --  0.91  TROPONINI  --   --   --  <0.03 <0.03 <0.03  --   --   --   < > = values in this interval not displayed.  Estimated Creatinine Clearance: 92.4 mL/min (by C-G formula based on SCr of 0.91 mg/dL).  Assessment:   Heparin level was therapeutic this morning (0.61) on heparin at 1150 units/hr.   Drip off for cath, to resume 8 hrs after sheath removed.  RN reports slow deflation of TR band due to oozing. TR band off ~2pm.   2V CAD, CABG planned for 2/9 after Plavix washout. Last Plavix dose 3/4 am.  Goal of Therapy:  Heparin level 0.3-0.7 units/ml Monitor platelets by anticoagulation protocol: Yes   Plan:   Resume heparin drip at ~10pm tonight at 1150 units/hr.  Daily heparin level and CBC.  Dennie FettersEgan, Geneve Kimpel Donovan. RPh Pager; [phone] 10/28/2016,3:43 PM

## 2016-10-28 NOTE — Interval H&P Note (Signed)
History and Physical Interval Note:  10/28/2016 9:03 AM  Ruby ColaGeorge G Lathon  has presented today for surgery, with the diagnosis of unstable angina with abnormal stress test. The various methods of treatment have been discussed with the patient and family. After consideration of risks, benefits and other options for treatment, the patient has consented to  Procedure(s): Left Heart Cath and Coronary Angiography (N/A) with possible percutaneous coronary intervention as a surgical intervention .  The patient's history has been reviewed, patient examined, no change in status, stable for surgery.  I have reviewed the patient's chart and labs.  Questions were answered to the patient's satisfaction.    Cath Lab Visit (complete for each Cath Lab visit)  Clinical Evaluation Leading to the Procedure:   ACS: Yes.    Non-ACS:    Anginal Classification: CCS III  Anti-ischemic medical therapy: Minimal Therapy (1 class of medications)  Non-Invasive Test Results: Intermediate-risk stress test findings: cardiac mortality 1-3%/year  Prior CABG: No previous CABG    Bryan Lemmaavid Alycen Mack

## 2016-10-29 ENCOUNTER — Inpatient Hospital Stay (HOSPITAL_COMMUNITY): Payer: Managed Care, Other (non HMO)

## 2016-10-29 DIAGNOSIS — I200 Unstable angina: Secondary | ICD-10-CM

## 2016-10-29 DIAGNOSIS — Z8711 Personal history of peptic ulcer disease: Secondary | ICD-10-CM

## 2016-10-29 DIAGNOSIS — Z01810 Encounter for preprocedural cardiovascular examination: Secondary | ICD-10-CM

## 2016-10-29 DIAGNOSIS — I2510 Atherosclerotic heart disease of native coronary artery without angina pectoris: Secondary | ICD-10-CM

## 2016-10-29 LAB — BASIC METABOLIC PANEL
ANION GAP: 3 — AB (ref 5–15)
BUN: 13 mg/dL (ref 6–20)
CHLORIDE: 107 mmol/L (ref 101–111)
CO2: 32 mmol/L (ref 22–32)
Calcium: 9.8 mg/dL (ref 8.9–10.3)
Creatinine, Ser: 0.88 mg/dL (ref 0.61–1.24)
GFR calc Af Amer: >60 mL/min (ref >60)
GLUCOSE: 109 mg/dL — AB (ref 65–99)
POTASSIUM: 4.2 mmol/L (ref 3.5–5.1)
Sodium: 142 mmol/L (ref 135–145)

## 2016-10-29 LAB — CBC
HEMATOCRIT: 41.2 % (ref 39.0–52.0)
HEMOGLOBIN: 13.4 g/dL (ref 13.0–17.0)
MCH: 28.8 pg (ref 26.0–34.0)
MCHC: 32.5 g/dL (ref 30.0–36.0)
MCV: 88.6 fL (ref 78.0–100.0)
PLATELETS: 175 10*3/uL (ref 150–400)
RBC: 4.65 MIL/uL (ref 4.22–5.81)
RDW: 13.8 % (ref 11.5–15.5)
WBC: 6.1 10*3/uL (ref 4.0–10.5)

## 2016-10-29 LAB — HEPARIN LEVEL (UNFRACTIONATED)
Heparin Unfractionated: 0.21 IU/mL — ABNORMAL LOW (ref 0.30–0.70)
Heparin Unfractionated: 0.5 IU/mL (ref 0.30–0.70)

## 2016-10-29 MED ORDER — FAMOTIDINE 20 MG PO TABS
20.0000 mg | ORAL_TABLET | Freq: Two times a day (BID) | ORAL | Status: DC
  Administered 2016-10-29 – 2016-10-31 (×5): 20 mg via ORAL
  Filled 2016-10-29 (×5): qty 1

## 2016-10-29 NOTE — Progress Notes (Signed)
Pre-op Cardiac Surgery  Carotid Findings:  Right - No evidence of significant ICA stenosis. Left - 1% to 39% ICA stenosis lower end of range. Bilateral - Vertebral artery flow is antegrade.  Upper Extremity Right Left  Brachial Pressures 131 Triphasic 136 Triphasic  Radial Waveforms Triphasic Triphasic  Ulnar Waveforms Triphasic Triphasic  Palmar Arch (Allen's Test) Normal Normal   Findings:  Doppler waveforms remained normal bilaterally with both radial and ulnar compressions    Lower  Extremity Right Left  Dorsalis Pedis 176 Triphasic 191 Triphasic  Posterior Tibial 180 Triphasic 181 Triphasic  Ankle/Brachial Indices 1.32 1.40    Findings:  ABIs and Doppler waveforms indicate normal arterial flow at rest bilaterally.  Graybar ElectricVirginia Shaquel Hughes, RVS 10/29/2016 4:30 PM

## 2016-10-29 NOTE — Progress Notes (Signed)
ANTICOAGULATION CONSULT NOTE - Follow Up Consult  Pharmacy Consult for Heparin Indication: chest pain/ACS, 2V CAD, awaiting CABG  No Known Allergies  Patient Measurements: Height: 6' (182.9 cm) Weight: 186 lb 3.2 oz (84.5 kg) IBW/kg (Calculated) : 77.6 Heparin Dosing Weight: 84.5 kg  Vital Signs: Temp: 98.1 F (36.7 C) (03/06 0449) BP: 145/85 (03/06 0449) Pulse Rate: 54 (03/06 0449)  Labs:  Recent Labs  10/27/16 0239  10/28/16 0210 10/29/16 0201 10/29/16 1340  HGB 13.8  --  13.4 13.4  --   HCT 42.2  --  41.7 41.2  --   PLT 184  --  166 175  --   HEPARINUNFRC  --   < > 0.61 0.21* 0.50  CREATININE 0.75  --  0.91 0.88  --   < > = values in this interval not displayed.  Estimated Creatinine Clearance: 95.5 mL/min (by C-G formula based on SCr of 0.88 mg/dL).  Assessment:   S/P cardiac cath 3/5; 2V CAD, CABG planned for 2/9 after Plavix washout. Last Plavix dose 3/4 am.   Heparin level was low this morning (0.21) on 1150 units/hr and rate increased to 1250 units/hr.  Heparin level is now therapeutic (0.50).  CBC stable.  Goal of Therapy:  Heparin level 0.3-0.7 units/ml Monitor platelets by anticoagulation protocol: Yes   Plan:   Continue heparin drip at 1250 units/hr.  Daily heparin level and CBC.  Dennie FettersEgan, Majel Giel Donovan. RPh Pager; [phone] 10/29/2016,2:29 PM

## 2016-10-29 NOTE — Progress Notes (Signed)
Progress Note  Patient Name: Ricky Hughes Date of Encounter: 10/29/2016  Primary Cardiologist: Dr. Elease Hashimoto  Subjective   Patient is feeling well; denies chest pain, SOB, and palpitations. He states he had one brief bout of mild chest pressure last night that self-resolved; he did not report this to nursing.  Inpatient Medications    Scheduled Meds: . aspirin EC  81 mg Oral Daily  . atorvastatin  40 mg Oral Daily  . famotidine (PEPCID) IV  20 mg Intravenous Q12H  . lisinopril  40 mg Oral Daily  . metoprolol succinate  25 mg Oral Daily  . pantoprazole  40 mg Oral Daily  . sodium chloride flush  3 mL Intravenous Q12H   Continuous Infusions: . heparin 1,250 Units/hr (10/29/16 0329)  . nitroGLYCERIN Stopped (10/27/16 1100)   PRN Meds: sodium chloride, acetaminophen, gi cocktail, morphine injection, ondansetron (ZOFRAN) IV, sodium chloride flush   Vital Signs    Vitals:   10/28/16 0936 10/28/16 0956 10/28/16 2123 10/29/16 0449  BP: (!) 161/105 (!) 147/77 139/82 (!) 145/85  Pulse: (!) 0 (!) 56  (!) 54  Resp: 11 16 18 18   Temp:  97.7 F (36.5 C) 98 F (36.7 C) 98.1 F (36.7 C)  TempSrc:  Oral Oral   SpO2: 97% 98% 99% 100%  Weight:      Height:       No intake or output data in the 24 hours ending 10/29/16 1027 Filed Weights   10/25/16 1623 10/25/16 2048 10/28/16 0451  Weight: 190 lb (86.2 kg) 187 lb 3.2 oz (84.9 kg) 186 lb 3.2 oz (84.5 kg)     Physical Exam   General: Well developed, well nourished, male appearing in no acute distress. Head: Normocephalic, atraumatic.  Neck: Supple without bruits, JVD. Lungs:  Resp regular and unlabored, CTA. Heart: bradycardic rate, regular rhythm, S1, S2, no S3, S4, or murmur; no rub. Abdomen: Soft, non-tender, non-distended with normoactive bowel sounds. No hepatomegaly. No rebound/guarding. No obvious abdominal masses. Extremities: No clubbing, cyanosis, No edema. Distal pedal pulses are 2+ bilaterally. Neuro: Alert and  oriented X 3. Moves all extremities spontaneously. Psych: Normal affect.  Labs    Chemistry Recent Labs Lab 10/25/16 1624  10/27/16 0239 10/28/16 0210 10/29/16 0201  NA  --   < > 142 141 142  K  --   < > 3.7 3.8 4.2  CL  --   < > 109 109 107  CO2  --   < > 29 29 32  GLUCOSE  --   < > 97 107* 109*  BUN  --   < > 14 16 13   CREATININE  --   < > 0.75 0.91 0.88  CALCIUM  --   < > 9.1 9.1 9.8  PROT 7.0  --   --   --   --   ALBUMIN 4.3  --   --   --   --   AST 25  --   --   --   --   ALT 20  --   --   --   --   ALKPHOS 54  --   --   --   --   BILITOT 1.4*  --   --   --   --   GFRNONAA  --   < > >60 >60 >60  GFRAA  --   < > >60 >60 >60  ANIONGAP  --   < > 4* 3* 3*  < > =  values in this interval not displayed.   Hematology Recent Labs Lab 10/27/16 0239 10/28/16 0210 10/29/16 0201  WBC 5.5 5.8 6.1  RBC 4.75 4.68 4.65  HGB 13.8 13.4 13.4  HCT 42.2 41.7 41.2  MCV 88.8 89.1 88.6  MCH 29.1 28.6 28.8  MCHC 32.7 32.1 32.5  RDW 14.1 14.1 13.8  PLT 184 166 175    Cardiac Enzymes Recent Labs Lab 10/25/16 1942 10/26/16 0232 10/26/16 0736  TROPONINI <0.03 <0.03 <0.03    Recent Labs Lab 10/25/16 1642  TROPIPOC 0.00     BNPNo results for input(s): BNP, PROBNP in the last 168 hours.   DDimer No results for input(s): DDIMER in the last 168 hours.   Radiology    No results found.   Telemetry    NSR to sinus bradycardia in the 50-60s - Personally Reviewed  ECG    10/27/16: sinus bradycardia, 54 bpm - Personally Reviewed   Cardiac Studies   Left heart Catheterization 10/28/16:  Prox RCA to Dist RCA lesion, 100 %stenosed. - The RPDA with retrograde flow into the posterior lateral branch fills via collaterals from the LAD. The RV marginal branch fills via collaterals from the distal LAD.  Mid LAD stent, 0 %stenosed.  Dist LAD lesion, 90 %stenosed - just beyond the stent  There is mild (2+) mitral regurgitation.  The left ventricular systolic function is  normal. The left ventricular ejection fraction is 55-65% by visual estimate.  LV end diastolic pressure is moderately elevated.   Patient has severe 2 vessel disease involving the LAD that is the main conduit to the occluded RCA. Given his risk factors and history of proximal compliance, he is probably best served with bypass surgery. We will stop the Plavix. CT surgery was consulted.  Otherwise continue treatment cardiac risk factors.  Plan: Return to nursing unit with TR band removal. DC Plavi Restart IV heparin 8 hours post cath CT surgical consult as been placed   Stress Myoview 10/26/16: Perfusion: There is evidence of a fixed perfusion defect involving the basilar aspect of the inferior wall. Inducible ischemia is identified in the adjacent mid to distal inferolateral wall.  Wall Motion: Normal left ventricular wall motion. No left ventricular dilation. Left Ventricular Ejection Fraction: 57 % End diastolic volume 122 ml End systolic volume 52 ml  IMPRESSION: 1. Evidence of inducible ischemia involving the mid to distal inferolateral wall. There appears to be a component of scar involving the basilar aspect of the inferior wall.  2. Normal left ventricular wall motion.  3. Left ventricular ejection fraction 57%  4. Non invasive risk stratification*: Intermediate   Patient Profile     61 y.o. male with a PMH significant for CAD s/p abnormal myoview and left heart catheterization with severe 2-vessel disease involving the proximal LAD and RCA. CT surgery was consulted. They scheduled CABG on Friday after plavix washout.   Assessment & Plan    1. CAD s/p abnormal stress test and s/p heart catheterization - heart cath with 2 vessel disease not amenable to stenting - plavix washout - plan for CABG on Friday, 11/01/16, with CT surgery - continue heparin gtt - recommend nitro PRN for chest pain   2. HTN - BP 140s/80s - continue toprol - would recommend  increasing lisinopril for better pressure control   3. PUD - continue famotidine and protonix     Signed, Marcelino Dusterngela Nicole Duke , PA-C 10:27 AM 10/29/2016 Pager: [phone]  Personally seen and examined. Agree with above. CAD--await  CABG Fri post Plavix washout No CP currently  Lungs clear. Cath site c/d/i  Donato Schultz, MD

## 2016-10-29 NOTE — Progress Notes (Signed)
PROGRESS NOTE    Ricky ColaGeorge G No  ZOX:[id number] DOB: January 24, 1956 DOA: 10/25/2016 PCP: Maximiano CossHUNGARLAND,JOHN DAVID, MD    Brief Narrative:  This is a 61 year old male who presents to the hospital chief complaint of chest pain. He is known to have coronary disease status post stenting in the LAD. He developed acute left neck/left chest pain, severe in intensity, radiating to his back, constant, worsening over the course of the day. On initial physical examination blood pressure 157/89, heart rate 80, respiratory rate 19, saturation 97%. His lungs were clear to auscultation, heart S1-S2 present rhythmic, abdomen soft and nontender, lower extremities no edema.Sodium 138, potassium 3.5, chloride 103, bicarbonate 24, glucose 120, BUN 14, creatinine 0.79, less than troponin 0.03. White count 8.0, hemoglobin 15.2, hematocrit 43.8, platelets 177. Chest x-ray clear for infiltrates. EKG sinus rhythm with left atrial enlargement. No ST elevations or ST depressions, no significant T wave abnormalities. The patient was admitted to the hospital with the working diagnosis of atypical chest pain rule out for acute coronary syndrome. Stress tess with intermediate risk, plan for coronary angiography. Patient with diffuse CAD, referred for surgical evaluation. Plan for CABG on 11/01/2016. Patient was on clopidogrel.    Assessment & Plan:   Principal Problem:   Chest pain Active Problems:   Coronary atherosclerosis of native coronary artery   History of peptic ulcer disease   Hypertension   Hyperlipidemia   Unstable angina pectoris (HCC)   Abnormal nuclear stress test    1. Unstable angina pectoris (present on admission). Continue heparin infusion for unstable angina. Continued on aspirin, statin, metoprolol succinate, lisinopril. Cardiac catheterization with multi vessel CAD (severe 2 vessel disease involving LAD in the main conduit to the occludded RCA). CT surgery consulted. Plan for CABG on Friday. Surgery  delayed due to recent use of clopidogrel.   2. Hypertension. Continue blood pressure control with metoprolol, lisinopril. Systolic blood pressure 140 to 160.   3. Dyslipidemia. Continue atorvastatin 40 mg daily.   4. Gastric esophageal reflux disease. Continue antiacid therapy, with famotidine and pantoprazole.    DVT prophylaxis:enoxaparin  Code Status:full  Family Communication:No family at the bedside  Disposition Plan:home   Consultants:  Cardiology   Procedures: Cardiac catheterization 03/5.     Subjective: Patient with no chest pain, no nausea or vomiting, tolerating po well. On heparin drip.  Objective: Vitals:   10/28/16 0936 10/28/16 0956 10/28/16 2123 10/29/16 0449  BP: (!) 161/105 (!) 147/77 139/82 (!) 145/85  Pulse: (!) 0 (!) 56  (!) 54  Resp: 11 16 18 18   Temp:  97.7 F (36.5 C) 98 F (36.7 C) 98.1 F (36.7 C)  TempSrc:  Oral Oral   SpO2: 97% 98% 99% 100%  Weight:      Height:       No intake or output data in the 24 hours ending 10/29/16 1120 Filed Weights   10/25/16 1623 10/25/16 2048 10/28/16 0451  Weight: 86.2 kg (190 lb) 84.9 kg (187 lb 3.2 oz) 84.5 kg (186 lb 3.2 oz)    Examination:  General exam: not in pain or dyspnea E ENT: No pallor or icterus, oral mucosa moist.  Respiratory system: Clear to auscultation. Respiratory effort normal. No wheezing, rales or rhonchi.  Cardiovascular system: S1 & S2 heard, RRR. No JVD, murmurs, rubs, gallops or clicks. No pedal edema. Gastrointestinal system: Abdomen is nondistended, soft and nontender. No organomegaly or masses felt. Normal bowel sounds heard. Central nervous system: Alert and oriented. No focal neurological deficits. Extremities:  Symmetric 5 x 5 power. Skin: No rashes, lesions or ulcers   Data Reviewed: I have personally reviewed following labs and imaging studies  CBC:  Recent Labs Lab 10/25/16 1636 10/27/16 0239 10/28/16 0210 10/29/16 0201  WBC 8.0 5.5 5.8 6.1    NEUTROABS  --  3.0  --   --   HGB 15.2 13.8 13.4 13.4  HCT 43.8 42.2 41.7 41.2  MCV 86.9 88.8 89.1 88.6  PLT 177 184 166 175   Basic Metabolic Panel:  Recent Labs Lab 10/25/16 1636 10/27/16 0239 10/28/16 0210 10/29/16 0201  NA 138 142 141 142  K 3.5 3.7 3.8 4.2  CL 103 109 109 107  CO2 24 29 29  32  GLUCOSE 128* 97 107* 109*  BUN 14 14 16 13   CREATININE 0.79 0.75 0.91 0.88  CALCIUM 9.5 9.1 9.1 9.8   GFR: Estimated Creatinine Clearance: 95.5 mL/min (by C-G formula based on SCr of 0.88 mg/dL). Liver Function Tests:  Recent Labs Lab 10/25/16 1624  AST 25  ALT 20  ALKPHOS 54  BILITOT 1.4*  PROT 7.0  ALBUMIN 4.3   No results for input(s): LIPASE, AMYLASE in the last 168 hours. No results for input(s): AMMONIA in the last 168 hours. Coagulation Profile:  Recent Labs Lab 10/25/16 1626  INR 1.07   Cardiac Enzymes:  Recent Labs Lab 10/25/16 1942 10/26/16 0232 10/26/16 0736  TROPONINI <0.03 <0.03 <0.03   BNP (last 3 results) No results for input(s): PROBNP in the last 8760 hours. HbA1C: No results for input(s): HGBA1C in the last 72 hours. CBG: No results for input(s): GLUCAP in the last 168 hours. Lipid Profile: No results for input(s): CHOL, HDL, LDLCALC, TRIG, CHOLHDL, LDLDIRECT in the last 72 hours. Thyroid Function Tests: No results for input(s): TSH, T4TOTAL, FREET4, T3FREE, THYROIDAB in the last 72 hours. Anemia Panel: No results for input(s): VITAMINB12, FOLATE, FERRITIN, TIBC, IRON, RETICCTPCT in the last 72 hours. Sepsis Labs: No results for input(s): PROCALCITON, LATICACIDVEN in the last 168 hours.  No results found for this or any previous visit (from the past 240 hour(s)).       Radiology Studies: No results found.      Scheduled Meds: . aspirin EC  81 mg Oral Daily  . atorvastatin  40 mg Oral Daily  . famotidine (PEPCID) IV  20 mg Intravenous Q12H  . lisinopril  40 mg Oral Daily  . metoprolol succinate  25 mg Oral Daily  .  pantoprazole  40 mg Oral Daily  . sodium chloride flush  3 mL Intravenous Q12H   Continuous Infusions: . heparin 1,250 Units/hr (10/29/16 0329)  . nitroGLYCERIN Stopped (10/27/16 1100)     LOS: 2 days    Ricky Hughes Annett Gula, MD [facility] Pager [phone]  If 7PM-7AM, please contact night-coverage [email or web address] Password [email or web address] 10/29/2016, 11:20 AM

## 2016-10-29 NOTE — Progress Notes (Deleted)
Pre-op Cardiac Surgery  Carotid Findings:  Right - No evidence of significant ICA stenosis. Left - 1% to 39% ICA stenosis. Bilateral - Vertebral artery flow is antegrade.  Upper Extremity Right Left  Brachial Pressures 131 Triphasic 136 triphasic  Radial Waveforms Triphasic Triphasic  Ulnar Waveforms Triphasic Triphasic  Palmar Arch (Allen's Test) Normal Normal   Findings:  Doppler waveforms remained normal bilaterally with both radial and    [place] ElectricVirginia Sanya Hughes, RVS  10/29/2016 9:00 AM

## 2016-10-29 NOTE — Progress Notes (Signed)
   10/29/16 1315  Clinical Encounter Type  Visited With Patient  Visit Type Follow-up  Spiritual Encounters  Spiritual Needs Emotional  Stress Factors  Patient Stress Factors Health changes  Checked in on Pt. Reports wife took home AD paperwork. Follow up tomorrow.

## 2016-10-29 NOTE — Progress Notes (Addendum)
ANTICOAGULATION CONSULT NOTE - Follow Up Consult  Pharmacy Consult for Heparin  Indication: Awaiting CABG  No Known Allergies  Patient Measurements: Height: 6' (182.9 cm) Weight: 186 lb 3.2 oz (84.5 kg) IBW/kg (Calculated) : 77.6  Vital Signs: Temp: 98 F (36.7 C) (03/05 2123) Temp Source: Oral (03/05 2123) BP: 139/82 (03/05 2123)  Labs:  Recent Labs  10/26/16 0736  10/27/16 0239 10/27/16 1758 10/28/16 0210 10/29/16 0201  HGB  --   < > 13.8  --  13.4 13.4  HCT  --   --  42.2  --  41.7 41.2  PLT  --   --  184  --  166 175  HEPARINUNFRC  --   --   --  0.52 0.61 0.21*  CREATININE  --   --  0.75  --  0.91 0.88  TROPONINI <0.03  --   --   --   --   --   < > = values in this interval not displayed.  Estimated Creatinine Clearance: 95.5 mL/min (by C-G formula based on SCr of 0.88 mg/dL).  Assessment: S/P cath with multi-vessel disease now awaiting CABG, heparin level low after re-start, no issues per RN.   Goal of Therapy:  Heparin level 0.3-0.7 units/ml Monitor platelets by anticoagulation protocol: Yes   Plan:  -Inc heparin to 1250 units/hr -1200 HL  Abran DukeLedford, Audre Cenci 10/29/2016,3:27 AM

## 2016-10-29 NOTE — Progress Notes (Signed)
CARDIAC REHAB PHASE I   Pt with reports of intermittent cp, will hold ambulation for now unless otherwise advised by MD. Cardiac surgery pre-op education completed. Reviewed IS, sternal precautions, activity progression, cardiac surgery booklet and cardiac surgery guidelines. Left instructions to view cardiac surgery videos. Encouraged oob to chair as tolerated. Pt verbalized understanding. Pt in bed, call bell within reach.   [id number] Joylene GrapesEmily C Latoshia Monrroy, RN, BSN 10/29/2016 12:07 PM

## 2016-10-30 ENCOUNTER — Inpatient Hospital Stay (HOSPITAL_COMMUNITY): Payer: Managed Care, Other (non HMO)

## 2016-10-30 DIAGNOSIS — E7800 Pure hypercholesterolemia, unspecified: Secondary | ICD-10-CM

## 2016-10-30 DIAGNOSIS — R0789 Other chest pain: Secondary | ICD-10-CM

## 2016-10-30 LAB — PULMONARY FUNCTION TEST
DL/VA % PRED: 88 %
DL/VA: 4.2 ml/min/mmHg/L
DLCO COR % PRED: 66 %
DLCO cor: 23.25 ml/min/mmHg
DLCO unc % pred: 63 %
DLCO unc: 22.28 ml/min/mmHg
FEF 25-75 POST: 0.58 L/s
FEF 25-75 Pre: 2.15 L/sec
FEF2575-%CHANGE-POST: -73 %
FEF2575-%PRED-POST: 19 %
FEF2575-%Pred-Pre: 71 %
FEV1-%CHANGE-POST: -47 %
FEV1-%PRED-POST: 34 %
FEV1-%Pred-Pre: 65 %
FEV1-Post: 1.29 L
FEV1-Pre: 2.48 L
FEV1FVC-%CHANGE-POST: -47 %
FEV1FVC-%PRED-PRE: 99 %
FEV6-%Change-Post: -3 %
FEV6-%Pred-Post: 64 %
FEV6-%Pred-Pre: 66 %
FEV6-PRE: 3.2 L
FEV6-Post: 3.09 L
FEV6FVC-%Change-Post: -3 %
FEV6FVC-%PRED-PRE: 105 %
FEV6FVC-%Pred-Post: 101 %
FVC-%CHANGE-POST: -1 %
FVC-%PRED-POST: 65 %
FVC-%PRED-PRE: 66 %
FVC-POST: 3.27 L
FVC-PRE: 3.32 L
POST FEV1/FVC RATIO: 39 %
PRE FEV6/FVC RATIO: 100 %
Post FEV6/FVC ratio: 96 %
Pre FEV1/FVC ratio: 75 %
RV % pred: 80 %
RV: 1.95 L
TLC % pred: 80 %
TLC: 5.94 L

## 2016-10-30 LAB — CBC
HCT: 40.7 % (ref 39.0–52.0)
Hemoglobin: 13.2 g/dL (ref 13.0–17.0)
MCH: 28.7 pg (ref 26.0–34.0)
MCHC: 32.4 g/dL (ref 30.0–36.0)
MCV: 88.5 fL (ref 78.0–100.0)
PLATELETS: 180 10*3/uL (ref 150–400)
RBC: 4.6 MIL/uL (ref 4.22–5.81)
RDW: 13.8 % (ref 11.5–15.5)
WBC: 5.8 10*3/uL (ref 4.0–10.5)

## 2016-10-30 LAB — HEPARIN LEVEL (UNFRACTIONATED): Heparin Unfractionated: 0.46 IU/mL (ref 0.30–0.70)

## 2016-10-30 MED ORDER — ALBUTEROL SULFATE (2.5 MG/3ML) 0.083% IN NEBU
2.5000 mg | INHALATION_SOLUTION | Freq: Once | RESPIRATORY_TRACT | Status: AC
  Administered 2016-10-30: 2.5 mg via RESPIRATORY_TRACT

## 2016-10-30 NOTE — Progress Notes (Signed)
ANTICOAGULATION CONSULT NOTE - Follow Up Consult  Pharmacy Consult for Heparin Indication: chest pain/ACS, 2V CAD, awaiting CABG  No Known Allergies  Patient Measurements: Height: 6' (182.9 cm) Weight: 186 lb 3.2 oz (84.5 kg) IBW/kg (Calculated) : 77.6 Heparin Dosing Weight: 84.5 kg  Vital Signs: Temp: 97.7 F (36.5 C) (03/07 0549) Temp Source: Oral (03/07 0549) BP: 126/85 (03/07 0549) Pulse Rate: 56 (03/07 0549)  Labs:  Recent Labs  10/28/16 0210 10/29/16 0201 10/29/16 1340 10/30/16 0207  HGB 13.4 13.4  --  13.2  HCT 41.7 41.2  --  40.7  PLT 166 175  --  180  HEPARINUNFRC 0.61 0.21* 0.50 0.46  CREATININE 0.91 0.88  --   --     Estimated Creatinine Clearance: 95.5 mL/min (by C-G formula based on SCr of 0.88 mg/dL).  Assessment:   S/P cardiac cath 3/5; 2V CAD, CABG planned for 2/9 after Plavix washout. Last Plavix dose 3/4 am.  Anticoag: IV heparin for recurrent CP.  HL 0.46, CBC stable. Plan CABG 3/9.   Goal of Therapy:  Heparin level 0.3-0.7 units/ml Monitor platelets by anticoagulation protocol: Yes   Plan:   Continue heparin drip at 1250 units/hr.  Daily heparin level and CBC.   Declan Mier S. Merilynn Finlandobertson, PharmD, BCPS Clinical Staff Pharmacist Pager [phone] 10/30/2016,10:10 AM

## 2016-10-30 NOTE — Progress Notes (Signed)
Tech offered Pt a bath. Pt stated to tech that he has already brushed his teeth and bath has been done.

## 2016-10-30 NOTE — Progress Notes (Signed)
Progress Note  Patient Name: Ricky Hughes Date of Encounter: 10/30/2016  Primary Cardiologist: Dr. Elease Hashimoto   Subjective   Doing ok. Currently CP free. No dyspnea. He had some mid scapular pain earlier that resolved with morphine.   Inpatient Medications    Scheduled Meds: . aspirin EC  81 mg Oral Daily  . atorvastatin  40 mg Oral Daily  . famotidine  20 mg Oral BID  . lisinopril  40 mg Oral Daily  . metoprolol succinate  25 mg Oral Daily  . pantoprazole  40 mg Oral Daily  . sodium chloride flush  3 mL Intravenous Q12H   Continuous Infusions: . heparin 1,250 Units/hr (10/29/16 1910)  . nitroGLYCERIN Stopped (10/27/16 1100)   PRN Meds: sodium chloride, acetaminophen, gi cocktail, morphine injection, ondansetron (ZOFRAN) IV, sodium chloride flush   Vital Signs    Vitals:   10/28/16 2123 10/29/16 0449 10/29/16 2308 10/30/16 0549  BP: 139/82 (!) 145/85 134/77 126/85  Pulse:  (!) 54 (!) 54 (!) 56  Resp: Temp: 98 F (36.7 C) 98.1 F (36.7 C) 97.7 F (36.5 C) 97.7 F (36.5 C)  TempSrc: Oral  Oral Oral  SpO2: 99% 100% 99% 99%  Weight:      Height:        Intake/Output Summary (Last 24 hours) at 10/30/16 1017 Last data filed at 10/30/16 0812  Gross per 24 hour  Intake              240 ml  Output                0 ml  Net              240 ml   Filed Weights   10/25/16 1623 10/25/16 2048 10/28/16 0451  Weight: 190 lb (86.2 kg) 187 lb 3.2 oz (84.9 kg) 186 lb 3.2 oz (84.5 kg)    Telemetry    NSR - Personally Reviewed   Physical Exam   GEN: No acute distress.   Neck: No JVD Cardiac: RRR, no murmurs, rubs, or gallops.  Respiratory: Clear to auscultation bilaterally. GI: Soft, nontender, non-distended  MS: No edema; No deformity. Neuro:  Nonfocal  Psych: Normal affect   Labs    Chemistry Recent Labs Lab 10/25/16 1624  10/27/16 0239 10/28/16 0210 10/29/16 0201  NA  --   < > 142 141 142  K  --   < > 3.7 3.8 4.2  CL  --   < > 109 109  107  CO2  --   < > 29 29 32  GLUCOSE  --   < > 97 107* 109*  BUN  --   < > CREATININE  --   < > 0.75 0.91 0.88  CALCIUM  --   < > 9.1 9.1 9.8  PROT 7.0  --   --   --   --   ALBUMIN 4.3  --   --   --   --   AST 25  --   --   --   --   ALT 20  --   --   --   --   ALKPHOS 54  --   --   --   --   BILITOT 1.4*  --   --   --   --   GFRNONAA  --   < > >60 >60 >60  GFRAA  --   < > >  60 >60 >60  ANIONGAP  --   < > 4* 3* 3*  < > = values in this interval not displayed.   Hematology Recent Labs Lab 10/28/16 0210 10/29/16 0201 10/30/16 0207  WBC 5.8 6.1 5.8  RBC 4.68 4.65 4.60  HGB 13.4 13.4 13.2  HCT 41.7 41.2 40.7  MCV 89.1 88.6 88.5  MCH 28.6 28.8 28.7  MCHC 32.1 32.5 32.4  RDW 14.1 13.8 13.8  PLT 166 175 180    Cardiac Enzymes Recent Labs Lab 10/25/16 1942 10/26/16 0232 10/26/16 0736  TROPONINI <0.03 <0.03 <0.03    Recent Labs Lab 10/25/16 1642  TROPIPOC 0.00     BNPNo results for input(s): BNP, PROBNP in the last 168 hours.   DDimer No results for input(s): DDIMER in the last 168 hours.   Radiology    No results found.  Cardiac Studies   Stress Myoview 10/26/16: Perfusion: There is evidence of a fixed perfusion defect involving the basilar aspect of the inferior wall. Inducible ischemia is identified in the adjacent mid to distal inferolateral wall.  Wall Motion: Normal left ventricular wall motion. No left ventricular dilation. Left Ventricular Ejection Fraction: 57 % End diastolic volume 122 ml End systolic volume 52 ml  IMPRESSION: 1. Evidence of inducible ischemia involving the mid to distal inferolateral wall. There appears to be a component of scar involving the basilar aspect of the inferior wall.  2. Normal left ventricular wall motion.  3. Left ventricular ejection fraction 57%  4. Non invasive risk stratification*: Intermediate  Left heart Catheterization 10/28/16:  Prox RCA to Dist RCA lesion, 100 %stenosed. - The RPDA  with retrograde flow into the posterior lateral branch fills via collaterals from the LAD. The RV marginal branch fills via collaterals from the distal LAD.  Mid LAD stent, 0 %stenosed.  Dist LAD lesion, 90 %stenosed - just beyond the stent  There is mild (2+) mitral regurgitation.  The left ventricular systolic function is normal. The left ventricular ejection fraction is 55-65% by visual estimate.  LV end diastolic pressure is moderately elevated.  Patient has severe 2 vessel disease involving the LAD that is the main conduit to the occluded RCA. Given his risk factors and history of proximal compliance, he is probably best served with bypass surgery. We will stop the Plavix. CT surgery was consulted.  Otherwise continue treatment cardiac risk factors.  Plan: Return to nursing unit with TR band removal. DC Plavi Restart IV heparin 8 hours post cath CT surgical consult as been placed  Patient Profile     61 y.o. male with a PMH significant for CAD s/p abnormal myoview and left heart catheterization with severe 2-vessel disease involving the proximal LAD and RCA. CT surgery was consulted. Plan is for CABG on Friday after plavix washout.   Assessment & Plan    1. CAD s/p abnormal stress test and s/p heart catheterization - heart cath with 2 vessel disease not amenable to stenting - plavix washout - plan for CABG on Friday, 11/01/16, with CT surgery - continue heparin gtt - currently CP free - recommend nitro PRN for chest pain  2. HTN - BP controlled on current regimen  - continue Toprol and lisinopril   3. PUD - continue famotidine and protonix  Signed, Robbie Lis, PA-C  10/30/2016, 10:17 AM    Personally seen and examined. Agree with above.  Awaiting bypass surgery Friday. Plavix washout. Blood pressure under good control. No changes made. Appears comfortable. Lungs are clear.  Candee Furbish, MD

## 2016-10-30 NOTE — Progress Notes (Signed)
PROGRESS NOTE    Ricky Hughes  ZOX:[id number] DOB: 01-20-1956 DOA: 10/25/2016 PCP: Maximiano Coss, MD   Outpatient Specialists:   Brief Narrative:  Ricky Hughes is a 61 y.o. With medical history significant of CAD s/p stenting in LAD, also history of GERD, peptic ulcer disease, hypertension, and hyperlipidemia who presented with chief complaint of pain in left chest, neck, and back that was constant and worsening over the course of the day associated with nausea and vomiting. He has been having similar symptoms over the last 1-2 years, but not this severe. Troponin flat, CXR clear, EKG with no ST elevations or depressions, no significant T wave changes. Underwent stress test and heart cath with need for surgical intervention (see below).    Assessment & Plan:   1. Unstable angina pectoris due to coronary atherosclerosis of native coronary artery. Chest pain present on admission. Had some chest pain last night, but nothing else. Cardiac cath demonstrates severe multiple coronary vessel disease. CABG is scheduled for Friday. Continue  Aspirin, Atorvastatin, Metoprolol, Lisinopril. Holding clopidogrel due to surgical intervention.     2. Hypertension. Chronic. Continue Lisinopril and Metoprolol.     3. Hyperlipidemia. Chronic. Continue Atorvastatin.    4. GERD. Chronic. Continue Famotidine, and pantoprazole.   DVT prophylaxis: heparin Code Status: Full Family Communication: No family at bedside Disposition Plan:  Home when ready   Consultants:   Cardiology  Cardiothoracic Surgery  Procedures:    Cardiac Cath  Study Conclusions:   Prox RCA to Dist RCA lesion, 100 %stenosed. - The RPDA with retrograde flow into the posterior lateral branch fills via collaterals from the LAD. The RV marginal branch fills via collaterals from the distal LAD.  Mid LAD stent, 0 %stenosed.  Dist LAD lesion, 90 %stenosed - just beyond the stent  There is mild (2+) mitral  regurgitation.  The left ventricular systolic function is normal. The left ventricular ejection fraction is 55-65% by visual estimate.  LV end diastolic pressure is moderately elevated.   Antimicrobials:  None    Subjective: Patient denies SOB, nausea, or vomiting. He did have some chest pain last night but has not had any since. Waiting for CABG on Friday.   Objective: Vitals:   10/28/16 2123 10/29/16 0449 10/29/16 2308 10/30/16 0549  BP: 139/82 (!) 145/85 134/77 126/85  Pulse:  (!) 54 (!) 54 (!) 56  Resp: 18 18 18 18   Temp: 98 F (36.7 C) 98.1 F (36.7 C) 97.7 F (36.5 C) 97.7 F (36.5 C)  TempSrc: Oral  Oral Oral  SpO2: 99% 100% 99% 99%  Weight:      Height:        Intake/Output Summary (Last 24 hours) at 10/30/16 1026 Last data filed at 10/30/16 8119  Gross per 24 hour  Intake              240 ml  Output                0 ml  Net              240 ml   Filed Weights   10/25/16 1623 10/25/16 2048 10/28/16 0451  Weight: 86.2 kg (190 lb) 84.9 kg (187 lb 3.2 oz) 84.5 kg (186 lb 3.2 oz)    Examination:  General exam: Appears calm and comfortable  Respiratory system: Clear to auscultation. Respiratory effort normal. Cardiovascular system: S1 & S2 heard, RRR. No JVD, murmurs, rubs, gallops or clicks. No pedal edema. Gastrointestinal system: Abdomen  is nondistended, soft and nontender. No organomegaly or masses felt. Normal bowel sounds heard. Central nervous system: Alert and oriented. No focal neurological deficits. Extremities: Symmetric 5 x 5 power. Skin: No rashes, lesions or ulcers Psychiatry: Judgement and insight appear normal. Mood & affect appropriate.     Data Reviewed: I have personally reviewed following labs and imaging studies  CBC:  Recent Labs Lab 10/25/16 1636 10/27/16 0239 10/28/16 0210 10/29/16 0201 10/30/16 0207  WBC 8.0 5.5 5.8 6.1 5.8  NEUTROABS  --  3.0  --   --   --   HGB 15.2 13.8 13.4 13.4 13.2  HCT 43.8 42.2 41.7 41.2 40.7   MCV 86.9 88.8 89.1 88.6 88.5  PLT 177 184 166 175 180   Basic Metabolic Panel:  Recent Labs Lab 10/25/16 1636 10/27/16 0239 10/28/16 0210 10/29/16 0201  NA 138 142 141 142  K 3.5 3.7 3.8 4.2  CL 103 109 109 107  CO2 24 29 29  32  GLUCOSE 128* 97 107* 109*  BUN 14 14 16 13   CREATININE 0.79 0.75 0.91 0.88  CALCIUM 9.5 9.1 9.1 9.8   GFR: Estimated Creatinine Clearance: 95.5 mL/min (by C-G formula based on SCr of 0.88 mg/dL). Liver Function Tests:  Recent Labs Lab 10/25/16 1624  AST 25  ALT 20  ALKPHOS 54  BILITOT 1.4*  PROT 7.0  ALBUMIN 4.3   No results for input(s): LIPASE, AMYLASE in the last 168 hours. No results for input(s): AMMONIA in the last 168 hours. Coagulation Profile:  Recent Labs Lab 10/25/16 1626  INR 1.07   Cardiac Enzymes:  Recent Labs Lab 10/25/16 1942 10/26/16 0232 10/26/16 0736  TROPONINI <0.03 <0.03 <0.03   BNP (last 3 results) No results for input(s): PROBNP in the last 8760 hours. HbA1C: No results for input(s): HGBA1C in the last 72 hours. CBG: No results for input(s): GLUCAP in the last 168 hours. Lipid Profile: No results for input(s): CHOL, HDL, LDLCALC, TRIG, CHOLHDL, LDLDIRECT in the last 72 hours. Thyroid Function Tests: No results for input(s): TSH, T4TOTAL, FREET4, T3FREE, THYROIDAB in the last 72 hours. Anemia Panel: No results for input(s): VITAMINB12, FOLATE, FERRITIN, TIBC, IRON, RETICCTPCT in the last 72 hours. Urine analysis: No results found for: COLORURINE, APPEARANCEUR, LABSPEC, PHURINE, GLUCOSEU, HGBUR, BILIRUBINUR, KETONESUR, PROTEINUR, UROBILINOGEN, NITRITE, LEUKOCYTESUR Sepsis Labs: @LABRCNTIP (procalcitonin:4,lacticidven:4)  )No results found for this or any previous visit (from the past 240 hour(s)).       Radiology Studies: No results found.      Scheduled Meds: . aspirin EC  81 mg Oral Daily  . atorvastatin  40 mg Oral Daily  . famotidine  20 mg Oral BID  . lisinopril  40 mg Oral Daily   . metoprolol succinate  25 mg Oral Daily  . pantoprazole  40 mg Oral Daily  . sodium chloride flush  3 mL Intravenous Q12H   Continuous Infusions: . heparin 1,250 Units/hr (10/29/16 1910)  . nitroGLYCERIN Stopped (10/27/16 1100)     LOS: 3 days      Ermalinda MemosKristen Harper, PA-S [facility]  10/30/2016  If 7PM-7AM, please contact night-coverage [email or web address] Password [email or web address] 10/30/2016, 10:26 AM   Attending MD note  Patient was seen, examined,treatment plan was discussed with the PA-S.  I have personally reviewed the clinical findings, lab, imaging studies and management of this patient in detail. I agree with the documentation, as recorded by the PA-S.   Patient without any chest pain this morning  On Exam: Gen. exam: Awake, alert,  not in any distress Chest: Good air entry bilaterally, no rhonchi or rales CVS: S1-S2 regular, no murmurs Abdomen: Soft, nontender and nondistended Neurology: Non-focal Skin: No rash or lesions  Impression Unstable angina  Plan Continue current antiplatelets, statin and beta blocker CABG plan for 3/9  Rest as above  [facility]

## 2016-10-31 ENCOUNTER — Encounter (HOSPITAL_COMMUNITY): Payer: Self-pay | Admitting: Anesthesiology

## 2016-10-31 DIAGNOSIS — I10 Essential (primary) hypertension: Secondary | ICD-10-CM

## 2016-10-31 LAB — CBC
HEMATOCRIT: 40.5 % (ref 39.0–52.0)
HEMOGLOBIN: 13.4 g/dL (ref 13.0–17.0)
MCH: 29 pg (ref 26.0–34.0)
MCHC: 33.1 g/dL (ref 30.0–36.0)
MCV: 87.7 fL (ref 78.0–100.0)
PLATELETS: 148 10*3/uL — AB (ref 150–400)
RBC: 4.62 MIL/uL (ref 4.22–5.81)
RDW: 13.9 % (ref 11.5–15.5)
WBC: 6.4 10*3/uL (ref 4.0–10.5)

## 2016-10-31 LAB — TYPE AND SCREEN
ABO/RH(D): A POS
Antibody Screen: NEGATIVE

## 2016-10-31 LAB — PLATELET INHIBITION P2Y12: Platelet Function  P2Y12: 222 [PRU] (ref 194–418)

## 2016-10-31 LAB — ABO/RH: ABO/RH(D): A POS

## 2016-10-31 LAB — HEPARIN LEVEL (UNFRACTIONATED): Heparin Unfractionated: 0.46 IU/mL (ref 0.30–0.70)

## 2016-10-31 MED ORDER — METOPROLOL TARTRATE 12.5 MG HALF TABLET: 12.5000 mg | ORAL_TABLET | Freq: Once | ORAL | Status: DC

## 2016-10-31 MED ORDER — TRANEXAMIC ACID 1000 MG/10ML IV SOLN
1.5000 mg/kg/h | INTRAVENOUS | Status: AC
  Administered 2016-11-01: 1.5 mg/kg/h via INTRAVENOUS
  Filled 2016-10-31: qty 25

## 2016-10-31 MED ORDER — CEFUROXIME SODIUM 1.5 G IJ SOLR
1.5000 g | INTRAMUSCULAR | Status: AC
  Administered 2016-11-01: .75 g via INTRAVENOUS
  Administered 2016-11-01: 1.5 g via INTRAVENOUS
  Filled 2016-10-31: qty 1.5

## 2016-10-31 MED ORDER — PLASMA-LYTE 148 IV SOLN
INTRAVENOUS | Status: AC
  Administered 2016-11-01: 500 mL
  Filled 2016-10-31: qty 2.5

## 2016-10-31 MED ORDER — DOPAMINE-DEXTROSE 3.2-5 MG/ML-% IV SOLN
0.0000 ug/kg/min | INTRAVENOUS | Status: DC
  Filled 2016-10-31: qty 250

## 2016-10-31 MED ORDER — BISACODYL 5 MG PO TBEC
5.0000 mg | DELAYED_RELEASE_TABLET | Freq: Once | ORAL | Status: DC
  Filled 2016-10-31: qty 1

## 2016-10-31 MED ORDER — VANCOMYCIN HCL 10 G IV SOLR
1500.0000 mg | INTRAVENOUS | Status: AC
  Administered 2016-11-01: 1500 mg via INTRAVENOUS
  Filled 2016-10-31: qty 1500

## 2016-10-31 MED ORDER — MAGNESIUM SULFATE 50 % IJ SOLN
40.0000 meq | INTRAMUSCULAR | Status: DC
  Filled 2016-10-31: qty 10

## 2016-10-31 MED ORDER — CHLORHEXIDINE GLUCONATE 0.12 % MT SOLN
15.0000 mL | Freq: Once | OROMUCOSAL | Status: AC
  Administered 2016-11-01: 15 mL via OROMUCOSAL
  Filled 2016-10-31: qty 15

## 2016-10-31 MED ORDER — DEXMEDETOMIDINE HCL IN NACL 400 MCG/100ML IV SOLN
0.1000 ug/kg/h | INTRAVENOUS | Status: AC
  Administered 2016-11-01: .3 ug/kg/h via INTRAVENOUS
  Filled 2016-10-31: qty 100

## 2016-10-31 MED ORDER — SODIUM CHLORIDE 0.9 % IV SOLN
INTRAVENOUS | Status: DC
  Filled 2016-10-31: qty 30

## 2016-10-31 MED ORDER — SODIUM CHLORIDE 0.9 % IV SOLN
30.0000 ug/min | INTRAVENOUS | Status: AC
  Administered 2016-11-01: 50 ug/min via INTRAVENOUS
  Filled 2016-10-31: qty 2

## 2016-10-31 MED ORDER — DEXTROSE 5 % IV SOLN
0.0000 ug/min | INTRAVENOUS | Status: DC
Start: 2016-11-01 — End: 2016-11-01
  Filled 2016-10-31: qty 4

## 2016-10-31 MED ORDER — DIAZEPAM 5 MG PO TABS
5.0000 mg | ORAL_TABLET | Freq: Once | ORAL | Status: DC
  Filled 2016-10-31: qty 1

## 2016-10-31 MED ORDER — DEXTROSE 5 % IV SOLN
750.0000 mg | INTRAVENOUS | Status: DC
  Filled 2016-10-31: qty 750

## 2016-10-31 MED ORDER — TRANEXAMIC ACID (OHS) PUMP PRIME SOLUTION
2.0000 mg/kg | INTRAVENOUS | Status: DC
  Filled 2016-10-31: qty 1.69

## 2016-10-31 MED ORDER — TRANEXAMIC ACID (OHS) BOLUS VIA INFUSION
15.0000 mg/kg | INTRAVENOUS | Status: AC
  Administered 2016-11-01: 1267.5 mg via INTRAVENOUS
  Filled 2016-10-31: qty 1268

## 2016-10-31 MED ORDER — ALPRAZOLAM 0.25 MG PO TABS: 0.2500 mg | ORAL_TABLET | ORAL | Status: DC | PRN

## 2016-10-31 MED ORDER — TEMAZEPAM 15 MG PO CAPS
15.0000 mg | ORAL_CAPSULE | Freq: Once | ORAL | Status: AC | PRN
  Administered 2016-10-31: 15 mg via ORAL
  Filled 2016-10-31: qty 1

## 2016-10-31 MED ORDER — POTASSIUM CHLORIDE 2 MEQ/ML IV SOLN
80.0000 meq | INTRAVENOUS | Status: DC
  Filled 2016-10-31: qty 40

## 2016-10-31 MED ORDER — CHLORHEXIDINE GLUCONATE CLOTH 2 % EX PADS
6.0000 | MEDICATED_PAD | Freq: Once | CUTANEOUS | Status: AC
  Administered 2016-10-31: 6 via TOPICAL

## 2016-10-31 MED ORDER — SODIUM CHLORIDE 0.9 % IV SOLN
INTRAVENOUS | Status: AC
  Administered 2016-11-01: .8 [IU]/h via INTRAVENOUS
  Filled 2016-10-31: qty 2.5

## 2016-10-31 MED ORDER — NITROGLYCERIN IN D5W 200-5 MCG/ML-% IV SOLN: 2.0000 ug/min | INTRAVENOUS | Status: DC

## 2016-10-31 MED ORDER — CHLORHEXIDINE GLUCONATE CLOTH 2 % EX PADS: 6.0000 | MEDICATED_PAD | Freq: Once | CUTANEOUS | Status: AC

## 2016-10-31 NOTE — Progress Notes (Signed)
3 Days Post-Op Procedure(s) (LRB): Left Heart Cath and Coronary Angiography (N/A) Subjective: No chest pain or shortness of breath  Objective: Vital signs in last 24 hours: Temp:  [97.5 F (36.4 C)-98 F (36.7 C)] 97.7 F (36.5 C) (03/08 1342) Pulse Rate:  [58-61] 59 (03/08 1342) Cardiac Rhythm: Normal sinus rhythm (03/08 0937) Resp:  [18-19] 18 (03/08 1342) BP: (109-138)/(62-78) 123/73 (03/08 1342) SpO2:  [100 %] 100 % (03/08 1342)  Hemodynamic parameters for last 24 hours:    Intake/Output from previous day: 03/07 0701 - 03/08 0700 In: 600 [P.O.:600] Out: -  Intake/Output this shift: Total I/O In: 720 [P.O.:720] Out: -   General appearance: alert and cooperative Heart: regular rate and rhythm, S1, S2 normal, no murmur, click, rub or gallop Lungs: clear to auscultation bilaterally  Lab Results:  Recent Labs  10/30/16 0207 10/31/16 0224  WBC 5.8 6.4  HGB 13.2 13.4  HCT 40.7 40.5  PLT 180 148*   BMET:  Recent Labs  10/29/16 0201  NA 142  K 4.2  CL 107  CO2 32  GLUCOSE 109*  BUN 13  CREATININE 0.88  CALCIUM 9.8    PT/INR: No results for input(s): LABPROT, INR in the last 72 hours. ABG No results found for: PHART, HCO3, TCO2, ACIDBASEDEF, O2SAT CBG (last 3)  No results for input(s): GLUCAP in the last 72 hours.  Assessment/Plan:  Severe 2-vessel CAD. Plan CABG tomorrow. His P2Y12 was 222 today which should be fine for surgery tomorrow. I discussed the operative procedure with the patient and his daughter including alternatives, benefits and risks; including but not limited to bleeding, blood transfusion, infection, stroke, myocardial infarction, graft failure, heart block requiring a permanent pacemaker, organ dysfunction, and death.  Ricky Hughes understands and agrees to proceed.     LOS: 4 days    Ricky Hughes 10/31/2016

## 2016-10-31 NOTE — Anesthesia Preprocedure Evaluation (Addendum)
Anesthesia Evaluation  Patient identified by MRN, date of birth, ID band Patient awake    Reviewed: Allergy & Precautions, H&P , NPO status , Patient's Chart, lab work & pertinent test results  Airway Mallampati: II  TM Distance: >3 FB Neck ROM: Full    Dental no notable dental hx. (+) Teeth Intact, Dental Advisory Given   Pulmonary neg pulmonary ROS,    Pulmonary exam normal breath sounds clear to auscultation       Cardiovascular Exercise Tolerance: Good hypertension, Pt. on medications and Pt. on home beta blockers + angina + CAD   Rhythm:Regular Rate:Normal     Neuro/Psych  Headaches, negative psych ROS   GI/Hepatic Neg liver ROS, GERD  ,  Endo/Other  negative endocrine ROS  Renal/GU negative Renal ROS  negative genitourinary   Musculoskeletal   Abdominal   Peds  Hematology negative hematology ROS (+)   Anesthesia Other Findings   Reproductive/Obstetrics negative OB ROS                           Anesthesia Physical Anesthesia Plan  ASA: IV  Anesthesia Plan: General   Post-op Pain Management:    Induction: Intravenous  Airway Management Planned: Oral ETT  Additional Equipment: Arterial line, CVP, PA Cath, TEE and Ultrasound Guidance Line Placement  Intra-op Plan:   Post-operative Plan: Post-operative intubation/ventilation  Informed Consent: I have reviewed the patients History and Physical, chart, labs and discussed the procedure including the risks, benefits and alternatives for the proposed anesthesia with the patient or authorized representative who has indicated his/her understanding and acceptance.   Dental advisory given  Plan Discussed with: CRNA  Anesthesia Plan Comments:         Anesthesia Quick Evaluation

## 2016-10-31 NOTE — Progress Notes (Signed)
PROGRESS NOTE    Ricky Hughes  WUJ:[id number] DOB: 10-13-1955 DOA: 10/25/2016 PCP: Maximiano Coss, MD   Brief Narrative:  Ricky Hughes is a 61 y.o. With medical history significant of CAD s/p stenting in LAD, also history of GERD, peptic ulcer disease, hypertension, and hyperlipidemia who presented with chief complaint of pain in left chest, neck, and back that was constant and worsening over the course of the day associated with nausea and vomiting. He has been having similar symptoms over the last 1-2 years, but not this severe. Troponin flat, CXR clear, EKG with no ST elevations or depressions, no significant T wave changes. Underwent stress test and heart cath with need for surgical intervention (see below).    Assessment & Plan:  1. Unstable angina pectoris due to coronary atherosclerosis of native coronary artery. Chest pain present on admission. Had some chest pain last night, but nothing else. Cardiac cath demonstrates severe multiple coronary vessel disease. CABG is scheduled for Friday. Continue  Aspirin, Atorvastatin, Metoprolol, Lisinopril. Holding clopidogrel due to surgical intervention.     2. Hypertension. Chronic. Continue Lisinopril and Metoprolol.     3. Hyperlipidemia. Chronic. Continue Atorvastatin.    4. GERD. Chronic. Continue Famotidine, and pantoprazole.   DVT prophylaxis: heparin Code Status: Full Family Communication: No family at bedside Disposition Plan:  Home when ready   Consultants:   Cardiology  Cardiothoracic Surgery  Procedures:    Cardiac Cath ? Study Conclusions:   Prox RCA to Dist RCA lesion, 100 %stenosed. - The RPDA with retrograde flow into the posterior lateral branch fills via collaterals from the LAD. The RV marginal branch fills via collaterals from the distal LAD.  Mid LAD stent, 0 %stenosed.  Dist LAD lesion, 90 %stenosed - just beyond the stent  There is mild (2+) mitral regurgitation.  The left  ventricular systolic function is normal. The left ventricular ejection fraction is 55-65% by visual estimate.  LV end diastolic pressure is moderately elevated.  Antimicrobials:  None    Subjective: Patient denies SOB, nausea, or vomiting. He did have some chest pain yesterday, received morphine with no chest pain since. Waiting for CABG on Friday.   Objective: Vitals:   10/30/16 0549 10/30/16 1435 10/30/16 2227 10/31/16 0459  BP: 126/85 109/62 126/78 138/78  Pulse: (!) 56 61 61 (!) 58  Resp: 18 19 18 18   Temp: 97.7 F (36.5 C) 97.5 F (36.4 C) 98 F (36.7 C) 97.5 F (36.4 C)  TempSrc: Oral Oral Oral Oral  SpO2: 99% 100% 100% 100%  Weight:      Height:        Intake/Output Summary (Last 24 hours) at 10/31/16 1021 Last data filed at 10/30/16 1300  Gross per 24 hour  Intake              360 ml  Output                0 ml  Net              360 ml   Filed Weights   10/25/16 1623 10/25/16 2048 10/28/16 0451  Weight: 86.2 kg (190 lb) 84.9 kg (187 lb 3.2 oz) 84.5 kg (186 lb 3.2 oz)    Examination:  General exam: Appears calm and comfortable  Respiratory system: Clear to auscultation. Respiratory effort normal. Cardiovascular system: S1 & S2 heard, RRR. No JVD, murmurs, rubs, gallops or clicks. No pedal edema. Gastrointestinal system: Abdomen is nondistended, soft and nontender. No organomegaly or  masses felt. Central nervous system: Alert and oriented. No focal neurological deficits. Extremities: Symmetric 5 x 5 power. Skin: No rashes, lesions or ulcers Psychiatry: Judgement and insight appear normal. Mood & affect appropriate.     Data Reviewed: I have personally reviewed following labs and imaging studies  CBC:  Recent Labs Lab 10/27/16 0239 10/28/16 0210 10/29/16 0201 10/30/16 0207 10/31/16 0224  WBC 5.5 5.8 6.1 5.8 6.4  NEUTROABS 3.0  --   --   --   --   HGB 13.8 13.4 13.4 13.2 13.4  HCT 42.2 41.7 41.2 40.7 40.5  MCV 88.8 89.1 88.6 88.5 87.7  PLT  184 166 175 180 148*   Basic Metabolic Panel:  Recent Labs Lab 10/25/16 1636 10/27/16 0239 10/28/16 0210 10/29/16 0201  NA 138 142 141 142  K 3.5 3.7 3.8 4.2  CL 103 109 109 107  CO2 24 29 29  32  GLUCOSE 128* 97 107* 109*  BUN 14 14 16 13   CREATININE 0.79 0.75 0.91 0.88  CALCIUM 9.5 9.1 9.1 9.8   GFR: Estimated Creatinine Clearance: 95.5 mL/min (by C-G formula based on SCr of 0.88 mg/dL). Liver Function Tests:  Recent Labs Lab 10/25/16 1624  AST 25  ALT 20  ALKPHOS 54  BILITOT 1.4*  PROT 7.0  ALBUMIN 4.3   No results for input(s): LIPASE, AMYLASE in the last 168 hours. No results for input(s): AMMONIA in the last 168 hours. Coagulation Profile:  Recent Labs Lab 10/25/16 1626  INR 1.07   Cardiac Enzymes:  Recent Labs Lab 10/25/16 1942 10/26/16 0232 10/26/16 0736  TROPONINI <0.03 <0.03 <0.03   BNP (last 3 results) No results for input(s): PROBNP in the last 8760 hours. HbA1C: No results for input(s): HGBA1C in the last 72 hours. CBG: No results for input(s): GLUCAP in the last 168 hours. Lipid Profile: No results for input(s): CHOL, HDL, LDLCALC, TRIG, CHOLHDL, LDLDIRECT in the last 72 hours. Thyroid Function Tests: No results for input(s): TSH, T4TOTAL, FREET4, T3FREE, THYROIDAB in the last 72 hours. Anemia Panel: No results for input(s): VITAMINB12, FOLATE, FERRITIN, TIBC, IRON, RETICCTPCT in the last 72 hours. Urine analysis: No results found for: COLORURINE, APPEARANCEUR, LABSPEC, PHURINE, GLUCOSEU, HGBUR, BILIRUBINUR, KETONESUR, PROTEINUR, UROBILINOGEN, NITRITE, LEUKOCYTESUR Sepsis Labs: @LABRCNTIP (procalcitonin:4,lacticidven:4)  )No results found for this or any previous visit (from the past 240 hour(s)).       Radiology Studies: No results found.      Scheduled Meds: . aspirin EC  81 mg Oral Daily  . atorvastatin  40 mg Oral Daily  . famotidine  20 mg Oral BID  . lisinopril  40 mg Oral Daily  . metoprolol succinate  25 mg Oral  Daily  . pantoprazole  40 mg Oral Daily  . sodium chloride flush  3 mL Intravenous Q12H   Continuous Infusions: . heparin 1,250 Units/hr (10/30/16 1640)  . nitroGLYCERIN Stopped (10/27/16 1100)     LOS: 4 days     Ermalinda MemosKristen Harper, PA-S [facility] 10/31/2016   If 7PM-7AM, please contact night-coverage [email or web address] Password [email or web address] 10/31/2016, 10:21 AM   Attending MD note  Patient was seen, examined,treatment plan was discussed with the PA-S.  I have personally reviewed the clinical findings, lab, imaging studies and management of this patient in detail. I agree with the documentation, as recorded by the PA-S.   Patient lying comfortably in bed-no chest pain  On Exam: Gen. exam: Awake, alert, not in any distress Chest: Good air entry bilaterally, no rhonchi or rales  CVS: S1-S2 regular, no murmurs Abdomen: Soft, nontender and nondistended Neurology: Non-focal Skin: No rash or lesions  Impression: Unstable angina  Plan CABG tomorrow Continue current management   Rest as above  [facility]

## 2016-10-31 NOTE — Care Management Note (Signed)
Case Management Note  Patient Details  Name: Ricky Hughes MRN: [id number] Date of Birth: [birth year]  Subjective/Objective:   Pt admitted on 10/25/16 with 2 vessel CAD; planning CABG on 11/01/16.  PTA, pt independent, lives with spouse.                   Action/Plan: Will follow post op for discharge planning.   Expected Discharge Date:                  Expected Discharge Plan:  Home w Home Health Services  In-House Referral:     Discharge planning Services  CM Consult  Post Acute Care Choice:    Choice offered to:     DME Arranged:    DME Agency:     HH Arranged:    HH Agency:     Status of Service:  In process, will continue to follow  If discussed at Long Length of Stay Meetings, dates discussed:    Additional Comments:  Glennon Mac, RN 10/31/2016, 4:41 PM

## 2016-10-31 NOTE — Progress Notes (Signed)
ANTICOAGULATION CONSULT NOTE - Follow Up Consult  Pharmacy Consult for Heparin Indication: CAD  Allergies  Allergen Reactions  . No Known Allergies     Patient Measurements: Height: 6' (182.9 cm) Weight: 186 lb 3.2 oz (84.5 kg) IBW/kg (Calculated) : 77.6 Heparin Dosing Weight: 84.5 kg  Vital Signs: Temp: 97.5 F (36.4 C) (03/08 0459) Temp Source: Oral (03/08 0459) BP: 138/78 (03/08 0459) Pulse Rate: 58 (03/08 0459)  Labs:  Recent Labs  10/29/16 0201 10/29/16 1340 10/30/16 0207 10/31/16 0224  HGB 13.4  --  13.2 13.4  HCT 41.2  --  40.7 40.5  PLT 175  --  180 148*  HEPARINUNFRC 0.21* 0.50 0.46 0.46  CREATININE 0.88  --   --   --     Estimated Creatinine Clearance: 95.5 mL/min (by C-G formula based on SCr of 0.88 mg/dL).   Assessment: Anticoag: IV heparin for recurrent CP.  HL 0.46, CBC stable. Plan CABG 3/9.   Goal of Therapy:  Heparin level 0.3-0.7 units/ml Monitor platelets by anticoagulation protocol: Yes   Plan:   Continue heparin at1250 units/hr  Daily HL and CBC  For OHS on 3/9 after Plavix washout   Leander Tout S. Merilynn Finlandobertson, PharmD, BCPS Clinical Staff Pharmacist Pager [phone]  Misty Stanleyobertson, Kelia Gibbon Stillinger 10/31/2016,10:51 AM

## 2016-10-31 NOTE — Progress Notes (Signed)
Progress Note  Patient Name: Ricky Hughes Date of Encounter: 10/31/2016  Primary Cardiologist: Dr. Elease Hashimoto   Subjective   Doing ok. Currently CP free. No dyspnea. Patiently waiting for surgery.   Inpatient Medications    Scheduled Meds: . aspirin EC  81 mg Oral Daily  . atorvastatin  40 mg Oral Daily  . famotidine  20 mg Oral BID  . lisinopril  40 mg Oral Daily  . metoprolol succinate  25 mg Oral Daily  . pantoprazole  40 mg Oral Daily  . sodium chloride flush  3 mL Intravenous Q12H   Continuous Infusions: . heparin 1,250 Units/hr (10/30/16 1640)  . nitroGLYCERIN Stopped (10/27/16 1100)   PRN Meds: sodium chloride, acetaminophen, gi cocktail, morphine injection, ondansetron (ZOFRAN) IV, sodium chloride flush   Vital Signs    Vitals:   10/30/16 0549 10/30/16 1435 10/30/16 2227 10/31/16 0459  BP: 126/85 109/62 126/78 138/78  Pulse: (!) 56 61 61 (!) 58  Resp: 18 19 18 18   Temp: 97.7 F (36.5 C) 97.5 F (36.4 C) 98 F (36.7 C) 97.5 F (36.4 C)  TempSrc: Oral Oral Oral Oral  SpO2: 99% 100% 100% 100%  Weight:      Height:        Intake/Output Summary (Last 24 hours) at 10/31/16 0832 Last data filed at 10/30/16 1300  Gross per 24 hour  Intake              360 ml  Output                0 ml  Net              360 ml   Filed Weights   10/25/16 1623 10/25/16 2048 10/28/16 0451  Weight: 190 lb (86.2 kg) 187 lb 3.2 oz (84.9 kg) 186 lb 3.2 oz (84.5 kg)    Telemetry    NSR - Personally Reviewed   Physical Exam   GEN: No acute distress.   Neck: No JVD Cardiac: RRR, no murmurs, rubs, or gallops.  Respiratory: Clear to auscultation bilaterally. GI: Soft, nontender, non-distended  MS: No edema; No deformity. Neuro:  Nonfocal  Psych: Normal affect   Labs    Chemistry Recent Labs Lab 10/25/16 1624  10/27/16 0239 10/28/16 0210 10/29/16 0201  NA  --   < > 142 141 142  K  --   < > 3.7 3.8 4.2  CL  --   < > 109 109 107  CO2  --   < > 29 29 32    GLUCOSE  --   < > 97 107* 109*  BUN  --   < > 14 16 13   CREATININE  --   < > 0.75 0.91 0.88  CALCIUM  --   < > 9.1 9.1 9.8  PROT 7.0  --   --   --   --   ALBUMIN 4.3  --   --   --   --   AST 25  --   --   --   --   ALT 20  --   --   --   --   ALKPHOS 54  --   --   --   --   BILITOT 1.4*  --   --   --   --   GFRNONAA  --   < > >60 >60 >60  GFRAA  --   < > >60 >60 >60  ANIONGAP  --   < >  4* 3* 3*  < > = values in this interval not displayed.   Hematology  Recent Labs Lab 10/29/16 0201 10/30/16 0207 10/31/16 0224  WBC 6.1 5.8 6.4  RBC 4.65 4.60 4.62  HGB 13.4 13.2 13.4  HCT 41.2 40.7 40.5  MCV 88.6 88.5 87.7  MCH 28.8 28.7 29.0  MCHC 32.5 32.4 33.1  RDW 13.8 13.8 13.9  PLT 175 180 148*    Cardiac Enzymes  Recent Labs Lab 10/25/16 1942 10/26/16 0232 10/26/16 0736  TROPONINI <0.03 <0.03 <0.03     Recent Labs Lab 10/25/16 1642  TROPIPOC 0.00     BNPNo results for input(s): BNP, PROBNP in the last 168 hours.   DDimer No results for input(s): DDIMER in the last 168 hours.   Radiology    No results found.  Cardiac Studies   Stress Myoview 10/26/16: Perfusion: There is evidence of a fixed perfusion defect involving the basilar aspect of the inferior wall. Inducible ischemia is identified in the adjacent mid to distal inferolateral wall.  Wall Motion: Normal left ventricular wall motion. No left ventricular dilation. Left Ventricular Ejection Fraction: 57 % End diastolic volume 122 ml End systolic volume 52 ml  IMPRESSION: 1. Evidence of inducible ischemia involving the mid to distal inferolateral wall. There appears to be a component of scar involving the basilar aspect of the inferior wall.  2. Normal left ventricular wall motion.  3. Left ventricular ejection fraction 57%  4. Non invasive risk stratification*: Intermediate  Left heart Catheterization 10/28/16:  Prox RCA to Dist RCA lesion, 100 %stenosed. - The RPDA with retrograde flow  into the posterior lateral branch fills via collaterals from the LAD. The RV marginal branch fills via collaterals from the distal LAD.  Mid LAD stent, 0 %stenosed.  Dist LAD lesion, 90 %stenosed - just beyond the stent  There is mild (2+) mitral regurgitation.  The left ventricular systolic function is normal. The left ventricular ejection fraction is 55-65% by visual estimate.  LV end diastolic pressure is moderately elevated.  Patient has severe 2 vessel disease involving the LAD that is the main conduit to the occluded RCA. Given his risk factors and history of proximal compliance, he is probably best served with bypass surgery. We will stop the Plavix. CT surgery was consulted.  Otherwise continue treatment cardiac risk factors.  Plan: Return to nursing unit with TR band removal. DC Plavi Restart IV heparin 8 hours post cath CT surgical consult as been placed  Patient Profile     61 y.o. male with a PMH significant for CAD s/p abnormal myoview and left heart catheterization with severe 2-vessel disease involving the proximal LAD and RCA. CT surgery was consulted. Plan is for CABG on Friday after plavix washout.   Assessment & Plan    1. CAD s/p abnormal stress test and s/p heart catheterization - heart cath with 2 vessel disease not amenable to stenting - plavix washout - plan for CABG on Friday, 11/01/16, with CT surgery - continue heparin gtt - currently CP free - recommend nitro PRN for chest pain  2. HTN - BP controlled on current regimen  - continue Toprol and lisinopril   3. PUD - continue famotidine and Protonix  Nothing further to add today. We are monitoring paitent while he awaits CABG tomorrow. HR and BP are both well controlled. He is CP free and on appropriate medical therapy, including IV heparin. We will continue to follow along post op.   Signed, Robbie Lis,  PA-C  10/31/2016, 8:32 AM    Personally seen and examined. Agree with above. No  new changes. Feels well. RRR, CTAB CABG tomorrow  Donato SchultzMark Skains, MD

## 2016-11-01 ENCOUNTER — Inpatient Hospital Stay (HOSPITAL_COMMUNITY): Payer: Managed Care, Other (non HMO)

## 2016-11-01 ENCOUNTER — Inpatient Hospital Stay (HOSPITAL_COMMUNITY): Payer: Managed Care, Other (non HMO) | Admitting: Anesthesiology

## 2016-11-01 ENCOUNTER — Encounter (HOSPITAL_COMMUNITY)
Admission: EM | Disposition: A | Discharge status: Home / Self Care | Payer: Self-pay | Source: Home / Self Care | Attending: Internal Medicine

## 2016-11-01 ENCOUNTER — Encounter (HOSPITAL_COMMUNITY): Payer: Self-pay | Admitting: *Deleted

## 2016-11-01 DIAGNOSIS — I2510 Atherosclerotic heart disease of native coronary artery without angina pectoris: Secondary | ICD-10-CM

## 2016-11-01 DIAGNOSIS — R9439 Abnormal result of other cardiovascular function study: Secondary | ICD-10-CM

## 2016-11-01 DIAGNOSIS — Z951 Presence of aortocoronary bypass graft: Secondary | ICD-10-CM

## 2016-11-01 HISTORY — PX: TEE WITHOUT CARDIOVERSION: SHX5443

## 2016-11-01 HISTORY — PX: CORONARY ARTERY BYPASS GRAFT: SHX141

## 2016-11-01 LAB — POCT I-STAT 3, ART BLOOD GAS (G3+)
ACID-BASE EXCESS: 3 mmol/L — AB (ref 0.0–2.0)
Acid-Base Excess: 4 mmol/L — ABNORMAL HIGH (ref 0.0–2.0)
Acid-Base Excess: 3 mmol/L — ABNORMAL HIGH (ref 0.0–2.0)
Acid-Base Excess: 1 mmol/L (ref 0.0–2.0)
Acid-base deficit: 1 mmol/L (ref 0.0–2.0)
Acid-base deficit: 3 mmol/L — ABNORMAL HIGH (ref 0.0–2.0)
BICARBONATE: 27.4 mmol/L (ref 20.0–28.0)
BICARBONATE: 22.2 mmol/L (ref 20.0–28.0)
Bicarbonate: 28.5 mmol/L — ABNORMAL HIGH (ref 20.0–28.0)
Bicarbonate: 24.4 mmol/L (ref 20.0–28.0)
Bicarbonate: 28.9 mmol/L — ABNORMAL HIGH (ref 20.0–28.0)
Bicarbonate: 24.1 mmol/L (ref 20.0–28.0)
O2 SAT: 99 %
O2 Saturation: 100 %
O2 Saturation: 100 %
O2 Saturation: 97 %
O2 Saturation: 99 %
O2 Saturation: 98 %
PCO2 ART: 38.2 mmHg (ref 32.0–48.0)
PCO2 ART: 39.7 mmHg (ref 32.0–48.0)
PCO2 ART: 35.8 mmHg (ref 32.0–48.0)
PH ART: 7.473 — AB (ref 7.350–7.450)
PH ART: 7.447 (ref 7.350–7.450)
PH ART: 7.352 (ref 7.350–7.450)
PH ART: 7.342 — AB (ref 7.350–7.450)
PO2 ART: 385 mmHg — AB (ref 83.0–108.0)
PO2 ART: 85 mmHg (ref 83.0–108.0)
Patient temperature: 37
Patient temperature: 36.9
TCO2: 30 mmol/L (ref 0–100)
TCO2: 29 mmol/L (ref 0–100)
TCO2: 26 mmol/L (ref 0–100)
TCO2: 30 mmol/L (ref 0–100)
TCO2: 25 mmol/L (ref 0–100)
TCO2: 23 mmol/L (ref 0–100)
pCO2 arterial: 52.1 mmHg — ABNORMAL HIGH (ref 32.0–48.0)
pCO2 arterial: 43.8 mmHg (ref 32.0–48.0)
pCO2 arterial: 41.2 mmHg (ref 32.0–48.0)
pH, Arterial: 7.441 (ref 7.350–7.450)
pH, Arterial: 7.355 (ref 7.350–7.450)
pO2, Arterial: 382 mmHg — ABNORMAL HIGH (ref 83.0–108.0)
pO2, Arterial: 134 mmHg — ABNORMAL HIGH (ref 83.0–108.0)
pO2, Arterial: 125 mmHg — ABNORMAL HIGH (ref 83.0–108.0)
pO2, Arterial: 123 mmHg — ABNORMAL HIGH (ref 83.0–108.0)

## 2016-11-01 LAB — CBC
HCT: 42 % (ref 39.0–52.0)
HCT: 37.2 % — ABNORMAL LOW (ref 39.0–52.0)
HEMATOCRIT: 35.4 % — AB (ref 39.0–52.0)
HEMOGLOBIN: 11.6 g/dL — AB (ref 13.0–17.0)
Hemoglobin: 13.8 g/dL (ref 13.0–17.0)
Hemoglobin: 12.3 g/dL — ABNORMAL LOW (ref 13.0–17.0)
MCH: 28.9 pg (ref 26.0–34.0)
MCH: 28.8 pg (ref 26.0–34.0)
MCH: 29.1 pg (ref 26.0–34.0)
MCHC: 32.9 g/dL (ref 30.0–36.0)
MCHC: 33.1 g/dL (ref 30.0–36.0)
MCHC: 32.8 g/dL (ref 30.0–36.0)
MCV: 88.1 fL (ref 78.0–100.0)
MCV: 87.1 fL (ref 78.0–100.0)
MCV: 88.7 fL (ref 78.0–100.0)
PLATELETS: 103 10*3/uL — AB (ref 150–400)
Platelets: 158 10*3/uL (ref 150–400)
Platelets: 108 10*3/uL — ABNORMAL LOW (ref 150–400)
RBC: 4.77 MIL/uL (ref 4.22–5.81)
RBC: 4.27 MIL/uL (ref 4.22–5.81)
RBC: 3.99 MIL/uL — ABNORMAL LOW (ref 4.22–5.81)
RDW: 13.9 % (ref 11.5–15.5)
RDW: 13.8 % (ref 11.5–15.5)
RDW: 14.1 % (ref 11.5–15.5)
WBC: 4.9 10*3/uL (ref 4.0–10.5)
WBC: 10.2 10*3/uL (ref 4.0–10.5)
WBC: 14.9 10*3/uL — ABNORMAL HIGH (ref 4.0–10.5)

## 2016-11-01 LAB — POCT I-STAT, CHEM 8
BUN: 16 mg/dL (ref 6–20)
BUN: 14 mg/dL (ref 6–20)
BUN: 14 mg/dL (ref 6–20)
BUN: 14 mg/dL (ref 6–20)
BUN: 13 mg/dL (ref 6–20)
BUN: 15 mg/dL (ref 6–20)
CALCIUM ION: 1.35 mmol/L (ref 1.15–1.40)
CALCIUM ION: 1.24 mmol/L (ref 1.15–1.40)
CHLORIDE: 105 mmol/L (ref 101–111)
CHLORIDE: 103 mmol/L (ref 101–111)
CHLORIDE: 102 mmol/L (ref 101–111)
CHLORIDE: 105 mmol/L (ref 101–111)
CREATININE: 0.6 mg/dL — AB (ref 0.61–1.24)
CREATININE: 0.6 mg/dL — AB (ref 0.61–1.24)
Calcium, Ion: 1.15 mmol/L (ref 1.15–1.40)
Calcium, Ion: 1.1 mmol/L — ABNORMAL LOW (ref 1.15–1.40)
Calcium, Ion: 1.16 mmol/L (ref 1.15–1.40)
Calcium, Ion: 1.23 mmol/L (ref 1.15–1.40)
Chloride: 105 mmol/L (ref 101–111)
Chloride: 110 mmol/L (ref 101–111)
Creatinine, Ser: 0.6 mg/dL — ABNORMAL LOW (ref 0.61–1.24)
Creatinine, Ser: 0.6 mg/dL — ABNORMAL LOW (ref 0.61–1.24)
Creatinine, Ser: 0.6 mg/dL — ABNORMAL LOW (ref 0.61–1.24)
Creatinine, Ser: 0.7 mg/dL (ref 0.61–1.24)
GLUCOSE: 98 mg/dL (ref 65–99)
GLUCOSE: 101 mg/dL — AB (ref 65–99)
GLUCOSE: 149 mg/dL — AB (ref 65–99)
Glucose, Bld: 104 mg/dL — ABNORMAL HIGH (ref 65–99)
Glucose, Bld: 97 mg/dL (ref 65–99)
Glucose, Bld: 102 mg/dL — ABNORMAL HIGH (ref 65–99)
HCT: 41 % (ref 39.0–52.0)
HCT: 33 % — ABNORMAL LOW (ref 39.0–52.0)
HEMATOCRIT: 38 % — AB (ref 39.0–52.0)
HEMATOCRIT: 29 % — AB (ref 39.0–52.0)
HEMATOCRIT: 28 % — AB (ref 39.0–52.0)
HEMATOCRIT: 34 % — AB (ref 39.0–52.0)
HEMOGLOBIN: 12.9 g/dL — AB (ref 13.0–17.0)
HEMOGLOBIN: 9.9 g/dL — AB (ref 13.0–17.0)
HEMOGLOBIN: 11.6 g/dL — AB (ref 13.0–17.0)
Hemoglobin: 13.9 g/dL (ref 13.0–17.0)
Hemoglobin: 9.5 g/dL — ABNORMAL LOW (ref 13.0–17.0)
Hemoglobin: 11.2 g/dL — ABNORMAL LOW (ref 13.0–17.0)
POTASSIUM: 4.4 mmol/L (ref 3.5–5.1)
POTASSIUM: 4.5 mmol/L (ref 3.5–5.1)
POTASSIUM: 3.8 mmol/L (ref 3.5–5.1)
POTASSIUM: 4.3 mmol/L (ref 3.5–5.1)
Potassium: 3.9 mmol/L (ref 3.5–5.1)
Potassium: 3.8 mmol/L (ref 3.5–5.1)
SODIUM: 142 mmol/L (ref 135–145)
SODIUM: 142 mmol/L (ref 135–145)
SODIUM: 137 mmol/L (ref 135–145)
Sodium: 139 mmol/L (ref 135–145)
Sodium: 140 mmol/L (ref 135–145)
Sodium: 143 mmol/L (ref 135–145)
TCO2: 30 mmol/L (ref 0–100)
TCO2: 27 mmol/L (ref 0–100)
TCO2: 28 mmol/L (ref 0–100)
TCO2: 27 mmol/L (ref 0–100)
TCO2: 27 mmol/L (ref 0–100)
TCO2: 27 mmol/L (ref 0–100)

## 2016-11-01 LAB — GLUCOSE, CAPILLARY
GLUCOSE-CAPILLARY: 103 mg/dL — AB (ref 65–99)
GLUCOSE-CAPILLARY: 96 mg/dL (ref 65–99)
Glucose-Capillary: 106 mg/dL — ABNORMAL HIGH (ref 65–99)
Glucose-Capillary: 105 mg/dL — ABNORMAL HIGH (ref 65–99)

## 2016-11-01 LAB — POCT I-STAT 3, VENOUS BLOOD GAS (G3P V)
ACID-BASE EXCESS: 1 mmol/L (ref 0.0–2.0)
Bicarbonate: 25.8 mmol/L (ref 20.0–28.0)
O2 SAT: 83 %
PCO2 VEN: 37 mmHg — AB (ref 44.0–60.0)
Patient temperature: 35
TCO2: 27 mmol/L (ref 0–100)
pH, Ven: 7.443 — ABNORMAL HIGH (ref 7.250–7.430)
pO2, Ven: 41 mmHg (ref 32.0–45.0)

## 2016-11-01 LAB — POCT I-STAT 4, (NA,K, GLUC, HGB,HCT)
Glucose, Bld: 107 mg/dL — ABNORMAL HIGH (ref 65–99)
HCT: 37 % — ABNORMAL LOW (ref 39.0–52.0)
Hemoglobin: 12.6 g/dL — ABNORMAL LOW (ref 13.0–17.0)
Potassium: 3.7 mmol/L (ref 3.5–5.1)
SODIUM: 143 mmol/L (ref 135–145)

## 2016-11-01 LAB — HEMOGLOBIN AND HEMATOCRIT, BLOOD
HEMATOCRIT: 29.1 % — AB (ref 39.0–52.0)
HEMOGLOBIN: 9.7 g/dL — AB (ref 13.0–17.0)

## 2016-11-01 LAB — BASIC METABOLIC PANEL
Anion gap: 7 (ref 5–15)
BUN: 14 mg/dL (ref 6–20)
CALCIUM: 9.5 mg/dL (ref 8.9–10.3)
CO2: 26 mmol/L (ref 22–32)
CREATININE: 0.8 mg/dL (ref 0.61–1.24)
Chloride: 108 mmol/L (ref 101–111)
Glucose, Bld: 94 mg/dL (ref 65–99)
Potassium: 3.9 mmol/L (ref 3.5–5.1)
SODIUM: 141 mmol/L (ref 135–145)

## 2016-11-01 LAB — CREATININE, SERUM
Creatinine, Ser: 0.83 mg/dL (ref 0.61–1.24)
GFR calc Af Amer: >60 mL/min (ref >60)
GFR calc non Af Amer: >60 mL/min (ref >60)

## 2016-11-01 LAB — HEMOGLOBIN A1C
HEMOGLOBIN A1C: 5.2 % (ref 4.8–5.6)
MEAN PLASMA GLUCOSE: 103 mg/dL

## 2016-11-01 LAB — PROTIME-INR
INR: 1.51
Prothrombin Time: 18.4 seconds — ABNORMAL HIGH (ref 11.4–15.2)

## 2016-11-01 LAB — APTT: aPTT: 31 seconds (ref 24–36)

## 2016-11-01 LAB — PLATELET COUNT: PLATELETS: 106 10*3/uL — AB (ref 150–400)

## 2016-11-01 LAB — MAGNESIUM: MAGNESIUM: 2.6 mg/dL — AB (ref 1.7–2.4)

## 2016-11-01 LAB — MRSA PCR SCREENING: MRSA by PCR: NEGATIVE

## 2016-11-01 SURGERY — CORONARY ARTERY BYPASS GRAFTING (CABG)
Anesthesia: General | Site: Esophagus

## 2016-11-01 MED ORDER — LACTATED RINGERS IV SOLN
INTRAVENOUS | Status: DC
  Administered 2016-11-01: 16:00:00 via INTRAVENOUS

## 2016-11-01 MED ORDER — FENTANYL CITRATE (PF) 250 MCG/5ML IJ SOLN
INTRAMUSCULAR | Status: AC
  Filled 2016-11-01: qty 5

## 2016-11-01 MED ORDER — ROCURONIUM BROMIDE 10 MG/ML (PF) SYRINGE
PREFILLED_SYRINGE | INTRAVENOUS | Status: DC | PRN
  Administered 2016-11-01: 20 mg via INTRAVENOUS

## 2016-11-01 MED ORDER — SODIUM CHLORIDE 0.9% FLUSH
3.0000 mL | Freq: Two times a day (BID) | INTRAVENOUS | Status: DC
  Administered 2016-11-02 – 2016-11-04 (×4): 3 mL via INTRAVENOUS

## 2016-11-01 MED ORDER — METOPROLOL TARTRATE 5 MG/5ML IV SOLN: 2.5000 mg | INTRAVENOUS | Status: DC | PRN

## 2016-11-01 MED ORDER — ROCURONIUM BROMIDE 100 MG/10ML IV SOLN
INTRAVENOUS | Status: DC | PRN
  Administered 2016-11-01 (×2): 25 mg via INTRAVENOUS
  Administered 2016-11-01: 50 mg via INTRAVENOUS
  Administered 2016-11-01: 25 mg via INTRAVENOUS

## 2016-11-01 MED ORDER — LACTATED RINGERS IV SOLN: 500.0000 mL | Freq: Once | INTRAVENOUS | Status: DC | PRN

## 2016-11-01 MED ORDER — THROMBIN 20000 UNITS EX SOLR: CUTANEOUS | Status: DC | PRN

## 2016-11-01 MED ORDER — THROMBIN 20000 UNITS EX SOLR
CUTANEOUS | Status: AC
  Filled 2016-11-01: qty 20000

## 2016-11-01 MED ORDER — 0.9 % SODIUM CHLORIDE (POUR BTL) OPTIME
TOPICAL | Status: DC | PRN
  Administered 2016-11-01: 6000 mL

## 2016-11-01 MED ORDER — DEXMEDETOMIDINE HCL IN NACL 200 MCG/50ML IV SOLN
0.0000 ug/kg/h | INTRAVENOUS | Status: DC
  Filled 2016-11-01 (×2): qty 50

## 2016-11-01 MED ORDER — PHENYLEPHRINE 40 MCG/ML (10ML) SYRINGE FOR IV PUSH (FOR BLOOD PRESSURE SUPPORT)
PREFILLED_SYRINGE | INTRAVENOUS | Status: AC
  Filled 2016-11-01: qty 10

## 2016-11-01 MED ORDER — SODIUM CHLORIDE 0.9 % IJ SOLN
INTRAMUSCULAR | Status: AC
  Filled 2016-11-01: qty 10

## 2016-11-01 MED ORDER — SODIUM CHLORIDE 0.9 % IV SOLN
INTRAVENOUS | Status: DC
  Filled 2016-11-01 (×2): qty 2.5

## 2016-11-01 MED ORDER — SODIUM CHLORIDE 0.9 % IV SOLN
0.0000 ug/min | INTRAVENOUS | Status: DC
  Filled 2016-11-01 (×2): qty 2

## 2016-11-01 MED ORDER — MAGNESIUM SULFATE 4 GM/100ML IV SOLN
4.0000 g | Freq: Once | INTRAVENOUS | Status: AC
  Administered 2016-11-01: 4 g via INTRAVENOUS
  Filled 2016-11-01: qty 100

## 2016-11-01 MED ORDER — PROTAMINE SULFATE 10 MG/ML IV SOLN
INTRAVENOUS | Status: DC | PRN
  Administered 2016-11-01: 10 mg via INTRAVENOUS
  Administered 2016-11-01: 30 mg via INTRAVENOUS
  Administered 2016-11-01 (×7): 50 mg via INTRAVENOUS

## 2016-11-01 MED ORDER — VANCOMYCIN HCL IN DEXTROSE 1-5 GM/200ML-% IV SOLN
1000.0000 mg | Freq: Once | INTRAVENOUS | Status: AC
  Administered 2016-11-01: 1000 mg via INTRAVENOUS
  Filled 2016-11-01: qty 200

## 2016-11-01 MED ORDER — MORPHINE SULFATE (PF) 2 MG/ML IV SOLN
1.0000 mg | INTRAVENOUS | Status: AC | PRN
  Administered 2016-11-01 (×2): 1 mg via INTRAVENOUS
  Filled 2016-11-01: qty 1

## 2016-11-01 MED ORDER — ACETAMINOPHEN 500 MG PO TABS
1000.0000 mg | ORAL_TABLET | Freq: Four times a day (QID) | ORAL | Status: DC
  Administered 2016-11-02 – 2016-11-05 (×14): 1000 mg via ORAL
  Filled 2016-11-01 (×14): qty 2

## 2016-11-01 MED ORDER — PROTAMINE SULFATE 10 MG/ML IV SOLN
INTRAVENOUS | Status: AC
  Filled 2016-11-01: qty 25

## 2016-11-01 MED ORDER — DOCUSATE SODIUM 100 MG PO CAPS
200.0000 mg | ORAL_CAPSULE | Freq: Every day | ORAL | Status: DC
  Administered 2016-11-02 – 2016-11-05 (×4): 200 mg via ORAL
  Filled 2016-11-01 (×4): qty 2

## 2016-11-01 MED ORDER — HEPARIN SODIUM (PORCINE) 1000 UNIT/ML IJ SOLN
INTRAMUSCULAR | Status: AC
  Filled 2016-11-01: qty 2

## 2016-11-01 MED ORDER — FAMOTIDINE IN NACL 20-0.9 MG/50ML-% IV SOLN
20.0000 mg | Freq: Two times a day (BID) | INTRAVENOUS | Status: AC
  Administered 2016-11-01 – 2016-11-02 (×2): 20 mg via INTRAVENOUS
  Filled 2016-11-01: qty 50

## 2016-11-01 MED ORDER — CHLORHEXIDINE GLUCONATE 0.12% ORAL RINSE (MEDLINE KIT)
15.0000 mL | Freq: Two times a day (BID) | OROMUCOSAL | Status: DC
  Administered 2016-11-01 – 2016-11-02 (×3): 15 mL via OROMUCOSAL

## 2016-11-01 MED ORDER — BISACODYL 10 MG RE SUPP: 10.0000 mg | Freq: Every day | RECTAL | Status: DC

## 2016-11-01 MED ORDER — ALBUMIN HUMAN 5 % IV SOLN
250.0000 mL | INTRAVENOUS | Status: AC | PRN
  Administered 2016-11-01 (×3): 250 mL via INTRAVENOUS
  Filled 2016-11-01 (×2): qty 250

## 2016-11-01 MED ORDER — ACETAMINOPHEN 650 MG RE SUPP
650.0000 mg | Freq: Once | RECTAL | Status: AC
  Administered 2016-11-01: 650 mg via RECTAL

## 2016-11-01 MED ORDER — INSULIN REGULAR BOLUS VIA INFUSION
0.0000 [IU] | Freq: Three times a day (TID) | INTRAVENOUS | Status: DC
  Filled 2016-11-01: qty 10

## 2016-11-01 MED ORDER — LACTATED RINGERS IV SOLN
INTRAVENOUS | Status: DC | PRN
  Administered 2016-11-01: 08:00:00 via INTRAVENOUS

## 2016-11-01 MED ORDER — ACETAMINOPHEN 160 MG/5ML PO SOLN: 1000.0000 mg | Freq: Four times a day (QID) | ORAL | Status: DC

## 2016-11-01 MED ORDER — FENTANYL CITRATE (PF) 250 MCG/5ML IJ SOLN
INTRAMUSCULAR | Status: AC
  Filled 2016-11-01: qty 20

## 2016-11-01 MED ORDER — THROMBIN 20000 UNITS EX SOLR
OROMUCOSAL | Status: DC | PRN
  Administered 2016-11-01: 20 mL via TOPICAL

## 2016-11-01 MED ORDER — ASPIRIN EC 325 MG PO TBEC
325.0000 mg | DELAYED_RELEASE_TABLET | Freq: Every day | ORAL | Status: DC
  Administered 2016-11-02 – 2016-11-03 (×2): 325 mg via ORAL
  Filled 2016-11-01 (×2): qty 1

## 2016-11-01 MED ORDER — GLYCOPYRROLATE 0.2 MG/ML IJ SOLN
INTRAMUSCULAR | Status: DC | PRN
  Administered 2016-11-01: 0.4 mg via INTRAVENOUS

## 2016-11-01 MED ORDER — PANTOPRAZOLE SODIUM 40 MG PO TBEC
40.0000 mg | DELAYED_RELEASE_TABLET | Freq: Every day | ORAL | Status: DC
  Administered 2016-11-03: 40 mg via ORAL
  Filled 2016-11-01: qty 1

## 2016-11-01 MED ORDER — OXYCODONE HCL 5 MG PO TABS
5.0000 mg | ORAL_TABLET | ORAL | Status: DC | PRN
  Administered 2016-11-02 – 2016-11-03 (×6): 10 mg via ORAL
  Filled 2016-11-01 (×6): qty 2

## 2016-11-01 MED ORDER — HEPARIN SODIUM (PORCINE) 1000 UNIT/ML IJ SOLN
INTRAMUSCULAR | Status: DC | PRN
  Administered 2016-11-01: 38000 [IU] via INTRAVENOUS

## 2016-11-01 MED ORDER — MORPHINE SULFATE (PF) 2 MG/ML IV SOLN
2.0000 mg | INTRAVENOUS | Status: DC | PRN
  Administered 2016-11-01 – 2016-11-02 (×2): 2 mg via INTRAVENOUS
  Administered 2016-11-02: 5 mg via INTRAVENOUS
  Administered 2016-11-02: 2 mg via INTRAVENOUS
  Administered 2016-11-02: 5 mg via INTRAVENOUS
  Administered 2016-11-03: 4 mg via INTRAVENOUS
  Filled 2016-11-01: qty 3
  Filled 2016-11-01: qty 2
  Filled 2016-11-01: qty 3
  Filled 2016-11-01 (×3): qty 1

## 2016-11-01 MED ORDER — ASPIRIN 81 MG PO CHEW: 324.0000 mg | CHEWABLE_TABLET | Freq: Every day | ORAL | Status: DC

## 2016-11-01 MED ORDER — ONDANSETRON HCL 4 MG/2ML IJ SOLN: 4.0000 mg | Freq: Four times a day (QID) | INTRAMUSCULAR | Status: DC | PRN

## 2016-11-01 MED ORDER — SODIUM CHLORIDE 0.9 % IV SOLN
30.0000 meq | Freq: Once | INTRAVENOUS | Status: AC
  Administered 2016-11-01: 30 meq via INTRAVENOUS
  Filled 2016-11-01: qty 15

## 2016-11-01 MED ORDER — NITROGLYCERIN IN D5W 200-5 MCG/ML-% IV SOLN: 0.0000 ug/min | INTRAVENOUS | Status: DC

## 2016-11-01 MED ORDER — ARTIFICIAL TEARS OP OINT
TOPICAL_OINTMENT | OPHTHALMIC | Status: AC
  Filled 2016-11-01: qty 3.5

## 2016-11-01 MED ORDER — MIDAZOLAM HCL 5 MG/5ML IJ SOLN
INTRAMUSCULAR | Status: DC | PRN
  Administered 2016-11-01: 2 mg via INTRAVENOUS
  Administered 2016-11-01: 3 mg via INTRAVENOUS
  Administered 2016-11-01: 5 mg via INTRAVENOUS

## 2016-11-01 MED ORDER — CHLORHEXIDINE GLUCONATE 0.12 % MT SOLN
15.0000 mL | OROMUCOSAL | Status: AC
  Administered 2016-11-01: 15 mL via OROMUCOSAL

## 2016-11-01 MED ORDER — SODIUM CHLORIDE 0.9 % IV SOLN
INTRAVENOUS | Status: DC | PRN
  Administered 2016-11-01: 14:00:00 via INTRAVENOUS

## 2016-11-01 MED ORDER — ROCURONIUM BROMIDE 50 MG/5ML IV SOSY
PREFILLED_SYRINGE | INTRAVENOUS | Status: AC
  Filled 2016-11-01: qty 5

## 2016-11-01 MED ORDER — TRAMADOL HCL 50 MG PO TABS
50.0000 mg | ORAL_TABLET | ORAL | Status: DC | PRN
  Administered 2016-11-02 (×2): 100 mg via ORAL
  Filled 2016-11-01 (×2): qty 2

## 2016-11-01 MED ORDER — ACETAMINOPHEN 160 MG/5ML PO SOLN: 650.0000 mg | Freq: Once | ORAL | Status: AC

## 2016-11-01 MED ORDER — SODIUM CHLORIDE 0.9 % IV SOLN: 250.0000 mL | INTRAVENOUS | Status: DC

## 2016-11-01 MED ORDER — MIDAZOLAM HCL 2 MG/2ML IJ SOLN: 2.0000 mg | INTRAMUSCULAR | Status: DC | PRN

## 2016-11-01 MED ORDER — ROCURONIUM BROMIDE 50 MG/5ML IV SOSY
PREFILLED_SYRINGE | INTRAVENOUS | Status: AC
  Filled 2016-11-01: qty 20

## 2016-11-01 MED ORDER — MIDAZOLAM HCL 10 MG/2ML IJ SOLN
INTRAMUSCULAR | Status: AC
  Filled 2016-11-01: qty 2

## 2016-11-01 MED ORDER — FENTANYL CITRATE (PF) 250 MCG/5ML IJ SOLN
INTRAMUSCULAR | Status: DC | PRN
  Administered 2016-11-01: 250 ug via INTRAVENOUS
  Administered 2016-11-01: 750 ug via INTRAVENOUS
  Administered 2016-11-01: 50 ug via INTRAVENOUS
  Administered 2016-11-01: 200 ug via INTRAVENOUS

## 2016-11-01 MED ORDER — LACTATED RINGERS IV SOLN
INTRAVENOUS | Status: DC
  Administered 2016-11-01: 08:00:00 via INTRAVENOUS

## 2016-11-01 MED ORDER — HEPARIN SODIUM (PORCINE) 1000 UNIT/ML IJ SOLN
INTRAMUSCULAR | Status: AC
  Filled 2016-11-01: qty 1

## 2016-11-01 MED ORDER — EPHEDRINE 5 MG/ML INJ
INTRAVENOUS | Status: AC
  Filled 2016-11-01: qty 10

## 2016-11-01 MED ORDER — CEFUROXIME SODIUM 1.5 G IJ SOLR
1.5000 g | Freq: Two times a day (BID) | INTRAMUSCULAR | Status: AC
  Administered 2016-11-01 – 2016-11-03 (×4): 1.5 g via INTRAVENOUS
  Filled 2016-11-01 (×4): qty 1.5

## 2016-11-01 MED ORDER — ORAL CARE MOUTH RINSE
15.0000 mL | Freq: Four times a day (QID) | OROMUCOSAL | Status: DC
  Administered 2016-11-02 (×2): 15 mL via OROMUCOSAL

## 2016-11-01 MED ORDER — BISACODYL 5 MG PO TBEC
10.0000 mg | DELAYED_RELEASE_TABLET | Freq: Every day | ORAL | Status: DC
  Administered 2016-11-02 – 2016-11-04 (×3): 10 mg via ORAL
  Filled 2016-11-01 (×4): qty 2

## 2016-11-01 MED ORDER — PROPOFOL 10 MG/ML IV BOLUS
INTRAVENOUS | Status: DC | PRN
  Administered 2016-11-01: 50 mg via INTRAVENOUS

## 2016-11-01 MED ORDER — SODIUM CHLORIDE 0.9% FLUSH: 3.0000 mL | INTRAVENOUS | Status: DC | PRN

## 2016-11-01 MED ORDER — METOPROLOL TARTRATE 12.5 MG HALF TABLET
12.5000 mg | ORAL_TABLET | Freq: Two times a day (BID) | ORAL | Status: DC
  Administered 2016-11-02: 12.5 mg via ORAL
  Filled 2016-11-01: qty 1

## 2016-11-01 MED ORDER — PROPOFOL 10 MG/ML IV BOLUS
INTRAVENOUS | Status: AC
  Filled 2016-11-01: qty 20

## 2016-11-01 MED ORDER — HEMOSTATIC AGENTS (NO CHARGE) OPTIME
TOPICAL | Status: DC | PRN
  Administered 2016-11-01: 1 via TOPICAL

## 2016-11-01 MED ORDER — SODIUM CHLORIDE 0.9 % IV SOLN
INTRAVENOUS | Status: DC
  Administered 2016-11-01: 16:00:00 via INTRAVENOUS

## 2016-11-01 MED ORDER — PROTAMINE SULFATE 10 MG/ML IV SOLN
INTRAVENOUS | Status: AC
  Filled 2016-11-01: qty 15

## 2016-11-01 MED ORDER — SODIUM CHLORIDE 0.45 % IV SOLN: INTRAVENOUS | Status: DC | PRN

## 2016-11-01 MED ORDER — INSULIN ASPART 100 UNIT/ML ~~LOC~~ SOLN
0.0000 [IU] | SUBCUTANEOUS | Status: DC
  Administered 2016-11-02 – 2016-11-03 (×2): 2 [IU] via SUBCUTANEOUS

## 2016-11-01 MED ORDER — METOPROLOL TARTRATE 25 MG/10 ML ORAL SUSPENSION: 12.5000 mg | Freq: Two times a day (BID) | ORAL | Status: DC

## 2016-11-01 MED FILL — Magnesium Sulfate Inj 50%: INTRAMUSCULAR | Qty: 10 | Status: AC

## 2016-11-01 MED FILL — Potassium Chloride Inj 2 mEq/ML: INTRAVENOUS | Qty: 40 | Status: AC

## 2016-11-01 MED FILL — Heparin Sodium (Porcine) Inj 1000 Unit/ML: INTRAMUSCULAR | Qty: 30 | Status: AC

## 2016-11-01 SURGICAL SUPPLY — 104 items
BAG DECANTER FOR FLEXI CONT (MISCELLANEOUS) ×4 IMPLANT
BANDAGE ACE 4X5 VEL STRL LF (GAUZE/BANDAGES/DRESSINGS) ×4 IMPLANT
BANDAGE ACE 6X5 VEL STRL LF (GAUZE/BANDAGES/DRESSINGS) ×4 IMPLANT
BASKET HEART  (ORDER IN 25'S) (MISCELLANEOUS) ×1
BASKET HEART (ORDER IN 25'S) (MISCELLANEOUS) ×1
BASKET HEART (ORDER IN 25S) (MISCELLANEOUS) ×2 IMPLANT
BLADE STERNUM SYSTEM 6 (BLADE) ×4 IMPLANT
BNDG GAUZE ELAST 4 BULKY (GAUZE/BANDAGES/DRESSINGS) ×4 IMPLANT
CANISTER SUCT 3000ML PPV (MISCELLANEOUS) ×4 IMPLANT
CATH ROBINSON RED A/P 18FR (CATHETERS) ×12 IMPLANT
CATH THORACIC 28FR (CATHETERS) ×8 IMPLANT
CATH THORACIC 36FR (CATHETERS) ×4 IMPLANT
CATH THORACIC 36FR RT ANG (CATHETERS) ×4 IMPLANT
CLIP TI MEDIUM 24 (CLIP) ×4 IMPLANT
CLIP TI WIDE RED SMALL 24 (CLIP) ×20 IMPLANT
CRADLE DONUT ADULT HEAD (MISCELLANEOUS) ×4 IMPLANT
DRAPE CARDIOVASCULAR INCISE (DRAPES) ×2
DRAPE SLUSH/WARMER DISC (DRAPES) ×4 IMPLANT
DRAPE SRG 135X102X78XABS (DRAPES) ×2 IMPLANT
DRSG COVADERM 4X10 (GAUZE/BANDAGES/DRESSINGS) ×4 IMPLANT
DRSG COVADERM 4X14 (GAUZE/BANDAGES/DRESSINGS) ×4 IMPLANT
ELECT CAUTERY BLADE 6.4 (BLADE) ×4 IMPLANT
ELECT REM PT RETURN 9FT ADLT (ELECTROSURGICAL) ×8
ELECTRODE REM PT RTRN 9FT ADLT (ELECTROSURGICAL) ×4 IMPLANT
FELT TEFLON 1X6 (MISCELLANEOUS) ×8 IMPLANT
GAUZE SPONGE 4X4 12PLY STRL (GAUZE/BANDAGES/DRESSINGS) ×8 IMPLANT
GAUZE SPONGE 4X4 12PLY STRL LF (GAUZE/BANDAGES/DRESSINGS) ×4 IMPLANT
GAUZE SPONGE 4X4 16PLY XRAY LF (GAUZE/BANDAGES/DRESSINGS) ×4 IMPLANT
GLOVE BIO SURGEON STRL SZ 6 (GLOVE) ×8 IMPLANT
GLOVE BIO SURGEON STRL SZ 6.5 (GLOVE) ×12 IMPLANT
GLOVE BIO SURGEON STRL SZ7 (GLOVE) IMPLANT
GLOVE BIO SURGEON STRL SZ7.5 (GLOVE) ×4 IMPLANT
GLOVE BIO SURGEON STRL SZ8.5 (GLOVE) ×4 IMPLANT
GLOVE BIO SURGEONS STRL SZ 6.5 (GLOVE) ×4
GLOVE BIOGEL PI IND STRL 6 (GLOVE) IMPLANT
GLOVE BIOGEL PI IND STRL 6.5 (GLOVE) ×4 IMPLANT
GLOVE BIOGEL PI IND STRL 7.0 (GLOVE) IMPLANT
GLOVE BIOGEL PI INDICATOR 6 (GLOVE)
GLOVE BIOGEL PI INDICATOR 6.5 (GLOVE) ×4
GLOVE BIOGEL PI INDICATOR 7.0 (GLOVE)
GLOVE EUDERMIC 7 POWDERFREE (GLOVE) ×8 IMPLANT
GLOVE ORTHO TXT STRL SZ7.5 (GLOVE) IMPLANT
GOWN STRL REUS W/ TWL LRG LVL3 (GOWN DISPOSABLE) ×8 IMPLANT
GOWN STRL REUS W/ TWL XL LVL3 (GOWN DISPOSABLE) ×2 IMPLANT
GOWN STRL REUS W/TWL LRG LVL3 (GOWN DISPOSABLE) ×8
GOWN STRL REUS W/TWL XL LVL3 (GOWN DISPOSABLE) ×2
HEMOSTAT POWDER SURGIFOAM 1G (HEMOSTASIS) ×12 IMPLANT
HEMOSTAT SURGICEL 2X14 (HEMOSTASIS) ×4 IMPLANT
INSERT FOGARTY 61MM (MISCELLANEOUS) IMPLANT
INSERT FOGARTY XLG (MISCELLANEOUS) IMPLANT
KIT BASIN OR (CUSTOM PROCEDURE TRAY) ×4 IMPLANT
KIT CATH CPB BARTLE (MISCELLANEOUS) ×4 IMPLANT
KIT ROOM TURNOVER OR (KITS) ×4 IMPLANT
KIT SUCTION CATH 14FR (SUCTIONS) ×4 IMPLANT
KIT VASOVIEW HEMOPRO VH 3000 (KITS) ×4 IMPLANT
NS IRRIG 1000ML POUR BTL (IV SOLUTION) ×24 IMPLANT
PACK OPEN HEART (CUSTOM PROCEDURE TRAY) ×4 IMPLANT
PAD ARMBOARD 7.5X6 YLW CONV (MISCELLANEOUS) ×8 IMPLANT
PAD ELECT DEFIB RADIOL ZOLL (MISCELLANEOUS) ×4 IMPLANT
PENCIL BUTTON HOLSTER BLD 10FT (ELECTRODE) ×4 IMPLANT
PUNCH AORTIC ROT 4.0MM RCL 40 (MISCELLANEOUS) ×4 IMPLANT
PUNCH AORTIC ROTATE 4.0MM (MISCELLANEOUS) IMPLANT
PUNCH AORTIC ROTATE 4.5MM 8IN (MISCELLANEOUS) ×4 IMPLANT
PUNCH AORTIC ROTATE 5MM 8IN (MISCELLANEOUS) IMPLANT
SET CARDIOPLEGIA MPS 5001102 (MISCELLANEOUS) ×4 IMPLANT
SPONGE INTESTINAL PEANUT (DISPOSABLE) IMPLANT
SPONGE LAP 18X18 X RAY DECT (DISPOSABLE) ×4 IMPLANT
SPONGE LAP 4X18 X RAY DECT (DISPOSABLE) ×4 IMPLANT
SUT BONE WAX W31G (SUTURE) ×4 IMPLANT
SUT MNCRL AB 4-0 PS2 18 (SUTURE) IMPLANT
SUT PROLENE 3 0 SH DA (SUTURE) IMPLANT
SUT PROLENE 3 0 SH1 36 (SUTURE) ×4 IMPLANT
SUT PROLENE 4 0 RB 1 (SUTURE)
SUT PROLENE 4 0 SH DA (SUTURE) IMPLANT
SUT PROLENE 4-0 RB1 .5 CRCL 36 (SUTURE) IMPLANT
SUT PROLENE 5 0 C 1 36 (SUTURE) IMPLANT
SUT PROLENE 6 0 C 1 30 (SUTURE) ×4 IMPLANT
SUT PROLENE 7 0 BV 1 (SUTURE) IMPLANT
SUT PROLENE 7 0 BV1 MDA (SUTURE) ×4 IMPLANT
SUT PROLENE 8 0 BV175 6 (SUTURE) ×4 IMPLANT
SUT SILK  1 MH (SUTURE) ×2
SUT SILK 1 MH (SUTURE) ×2 IMPLANT
SUT STEEL STERNAL CCS#1 18IN (SUTURE) IMPLANT
SUT STEEL SZ 6 DBL 3X14 BALL (SUTURE) ×12 IMPLANT
SUT VIC AB 1 CTX 36 (SUTURE) ×4
SUT VIC AB 1 CTX36XBRD ANBCTR (SUTURE) ×4 IMPLANT
SUT VIC AB 2-0 CT1 27 (SUTURE) ×4
SUT VIC AB 2-0 CT1 TAPERPNT 27 (SUTURE) ×4 IMPLANT
SUT VIC AB 2-0 CTX 27 (SUTURE) IMPLANT
SUT VIC AB 3-0 SH 27 (SUTURE)
SUT VIC AB 3-0 SH 27X BRD (SUTURE) IMPLANT
SUT VIC AB 3-0 X1 27 (SUTURE) ×8 IMPLANT
SUT VICRYL 4-0 PS2 18IN ABS (SUTURE) IMPLANT
SUTURE E-PAK OPEN HEART (SUTURE) ×4 IMPLANT
SYSTEM SAHARA CHEST DRAIN ATS (WOUND CARE) ×4 IMPLANT
TAPE CLOTH SURG 4X10 WHT LF (GAUZE/BANDAGES/DRESSINGS) ×8 IMPLANT
TOWEL GREEN STERILE (TOWEL DISPOSABLE) ×16 IMPLANT
TOWEL GREEN STERILE FF (TOWEL DISPOSABLE) ×8 IMPLANT
TOWEL OR 17X24 6PK STRL BLUE (TOWEL DISPOSABLE) IMPLANT
TOWEL OR 17X26 10 PK STRL BLUE (TOWEL DISPOSABLE) IMPLANT
TRAY FOLEY IC TEMP SENS 16FR (CATHETERS) ×4 IMPLANT
TUBING INSUFFLATION (TUBING) ×4 IMPLANT
UNDERPAD 30X30 (UNDERPADS AND DIAPERS) ×4 IMPLANT
WATER STERILE IRR 1000ML POUR (IV SOLUTION) ×8 IMPLANT

## 2016-11-01 NOTE — Transfer of Care (Signed)
Immediate Anesthesia Transfer of Care Note  Patient: Ricky Hughes  Procedure(s) Performed: Procedure(s): CORONARY ARTERY BYPASS GRAFTING (CABG) TIMES TWO USING BILATERAL INTERNAL MAMMARY ARTERIES (N/A) TRANSESOPHAGEAL ECHOCARDIOGRAM (TEE) (N/A)  Patient Location: SICU  Anesthesia Type:General  Level of Consciousness: sedated, unresponsive and Patient remains intubated per anesthesia plan  Airway & Oxygen Therapy: Patient remains intubated per anesthesia plan and Patient placed on Ventilator (see vital sign flow sheet for setting)  Post-op Assessment: Report given to RN and Post -op Vital signs reviewed and stable  Post vital signs: Reviewed and stable  Last Vitals:  Vitals:   11/01/16 0354 11/01/16 1549  BP: 127/78   Pulse: (!) 53 81  Resp: 18 14  Temp: 36.4 C     Last Pain:  Vitals:   11/01/16 0354  TempSrc: Oral  PainSc:       Patients Stated Pain Goal: 0 (10/27/16 2015)  Complications: No apparent anesthesia complications

## 2016-11-01 NOTE — Brief Op Note (Signed)
10/25/2016 - 11/01/2016  11:58 AM  PATIENT:  Ricky Hughes  61 y.o. male  PRE-OPERATIVE DIAGNOSIS:  CAD  POST-OPERATIVE DIAGNOSIS:  CAD  PROCEDURE:  Procedure(s): CORONARY ARTERY BYPASS GRAFTING (CABG) (N/A) TRANSESOPHAGEAL ECHOCARDIOGRAM (TEE) (N/A) LIMA-LAD RIMA-RPD   SURGEON:  Surgeon(s) and Role:    * Alleen BorneBryan K Bartle, MD - Primary  PHYSICIAN ASSISTANT: WAYNE GOLD PA-C  ANESTHESIA:   general  EBL:  Total I/O In: -  Out: 275 [Urine:275]  BLOOD ADMINISTERED:none  DRAINS: 3 Chest Tube(s) in the PERICARDIM AND PLEURAL TUBES   LOCAL MEDICATIONS USED:  NONE  SPECIMEN:  No Specimen  DISPOSITION OF SPECIMEN:  N/A  COUNTS:  YES  TOURNIQUET:  * No tourniquets in log *  DICTATION: .Dragon Dictation  PLAN OF CARE: Admit to inpatient   PATIENT DISPOSITION:  ICU - intubated and hemodynamically stable.   Delay start of Pharmacological VTE agent (>24hrs) due to surgical blood loss or risk of bleeding: yes  COMPLICATIONS: NO KNOWN

## 2016-11-01 NOTE — Procedures (Signed)
Extubation Procedure Note  Patient Details:   Name: Ricky Hughes DOB: Mar 01, 1956 MRN: [id number]   Airway Documentation:     Evaluation  O2 sats: stable throughout Complications: No apparent complications Patient did tolerate procedure well. Bilateral Breath Sounds: Clear, Diminished   Yes, pt able to cough to clear secretions and hoarsely vocalize name and DOB. Pt positive for cuff leak before extubation with a NIF of -40 and VC of 8L. RT placed pt on 4L humidified [place] and pt is tolerating well       Tacy Learnurriff, Janina Trafton E 11/01/2016, 10:14 PM

## 2016-11-01 NOTE — Anesthesia Postprocedure Evaluation (Signed)
Anesthesia Post Note  Patient: Ricky Hughes  Procedure(s) Performed: Procedure(s) (LRB): CORONARY ARTERY BYPASS GRAFTING (CABG) TIMES TWO USING BILATERAL INTERNAL MAMMARY ARTERIES (N/A) TRANSESOPHAGEAL ECHOCARDIOGRAM (TEE) (N/A)  Patient location during evaluation: SICU Anesthesia Type: General Level of consciousness: sedated Pain management: pain level controlled Vital Signs Assessment: post-procedure vital signs reviewed and stable Respiratory status: patient remains intubated per anesthesia plan Cardiovascular status: stable Anesthetic complications: no       Last Vitals:  Vitals:   11/01/16 0354 11/01/16 1549  BP: 127/78   Pulse: (!) 53 81  Resp: 18 14  Temp: 36.4 C     Last Pain:  Vitals:   11/01/16 0354  TempSrc: Oral  PainSc:                  Jeni Duling,W. EDMOND

## 2016-11-01 NOTE — Progress Notes (Signed)
Rapid wean failed due to low volumes and CO2 greater than 50. (52). Will re attempt shortly after allowing pt rest.

## 2016-11-01 NOTE — Progress Notes (Signed)
Rapid Wean Protocol started at this time.   

## 2016-11-01 NOTE — Op Note (Signed)
CARDIOVASCULAR SURGERY OPERATIVE NOTE  11/01/2016  Surgeon:  Alleen Borne, MD  First Assistant: Gershon Crane, [facility]   Preoperative Diagnosis:  Severe multi-vessel coronary artery disease   Postoperative Diagnosis:  Same   Procedure:  1. Median Sternotomy 2. Extracorporeal circulation 3.   Coronary artery bypass grafting x 2   Left internal mammary graft to the LAD  Free right internal mammary graft to the PDA   Anesthesia:  General Endotracheal   Clinical History/Surgical Indication:  The patient is a 61 year old gentleman with hypertension and coronary disease s/p PCI with stenting of the LAD in 2010. He was cathed in 2013 showing a patent stent in the LAD with 50-60% stenosis beyond the stent, 20-30% OM stenosis and mild RCA disease. He was managed medically. He reports a 1-2 year history of exertional fatigue and shortness of breath, left chest pressure type pain radiating to back. The symptoms were occasional at first but now occurring more frequently and more severe. He has had some pain at rest. He has been taking some NTG at home and trying to ignore it. He was at work and developed pain in the left chest and neck, felt weak in legs and sat down. He had some associated N/V. He laid on the floor and his coworkers brought him to the ER. He ruled out for MI but had a myoview on 10/26/2016 showing an old IMI with peri-infarct ischemia. He had some more CP since admission and underwent cath this am showing patent LAD stent with 90% mid LAD stenosis just beyond the stent. The RCA is occluded with left to right collat to PDA and PL. LVEF normal with mild MR. LVEDP moderately elevated at 25.  The gentleman has severe 2-vessel coronary artery disease with unstable anginal symptoms. His LAD is 90% stenosed just beyond the prior stent and the RCA is occluded proximally with left to right collaterals  to a large PDA and PL. I agree that CABG is the best treatment for this gentleman. I discussed the operative procedure with the patient including alternatives, benefits and risks; including but not limited to bleeding, blood transfusion, infection, stroke, myocardial infarction, graft failure, heart block requiring a permanent pacemaker, organ dysfunction, and death. His wife is not here at this time but I will discuss it with her later. Ricky Hughes understands and agrees to proceed.  Preparation:  The patient was seen in the preoperative holding area and the correct patient, correct operation were confirmed with the patient after reviewing the medical record and catheterization. The consent was signed by me. Preoperative antibiotics were given. A pulmonary arterial line and radial arterial line were placed by the anesthesia team. The patient was taken back to the operating room and positioned supine on the operating room table. After being placed under general endotracheal anesthesia by the anesthesia team a foley catheter was placed. The neck, chest, abdomen, and both legs were prepped with betadine soap and solution and draped in the usual sterile manner. A surgical time-out was taken and the correct patient and operative procedure were confirmed with the nursing and anesthesia staff.   Cardiopulmonary Bypass:  A median sternotomy was performed. The pericardium was opened in the midline. Right ventricular function appeared normal. The ascending aorta was of normal size and had no palpable plaque. There were no contraindications to aortic cannulation or cross-clamping. The patient was fully systemically heparinized and the ACT was maintained > 400 sec. The proximal aortic arch was cannulated with a  20 F aortic cannula for arterial inflow. Venous cannulation was performed via the right atrial appendage using a two-staged venous cannula. An antegrade cardioplegia/vent cannula was inserted into the  mid-ascending aorta. Aortic occlusion was performed with a single cross-clamp. Systemic cooling to 32 degrees Centigrade and topical cooling of the heart with iced saline were used. Hyperkalemic antegrade cold blood cardioplegia was used to induce diastolic arrest and was then given at about 20 minute intervals throughout the period of arrest to maintain myocardial temperature at or below 10 degrees centigrade. A temperature probe was inserted into the interventricular septum and an insulating pad was placed in the pericardium.   Left internal mammary artery harvest:  The left side of the sternum was retracted using the Rultract retractor. The left internal mammary artery was harvested as a pedicle graft. All side branches were clipped. It was a large-sized vessel of good quality with excellent blood flow. It was ligated distally and divided. It was sprayed with topical papaverine solution to prevent vasospasm.   Endoscopic vein harvest:  We planned on harvesting the right greater saphenous vein and looked for it through a small incision in the medial thigh. The only vein we could find was too small and it wasn't clear if this was really the GSV. Therefore a small incision was made below the knee and we could only find a small vein. Then we made a small incision in the left medial thigh and still could only find a small vein. Since the LIMA was a large and long vessel I decided to use the RIMA to the PDA.  Right internal mammary artery harvest:  The right side of the sternum was retracted using the Rultract retractor. The right internal mammary artery was harvested as a pedicle graft. All side branches were clipped. It was a large-sized vessel of good quality with excellent blood flow. It was ligated distally and divided. It was sprayed with topical papaverine solution to prevent vasospasm. It was then suture ligated proximally with 2-0 silk and divided.   Coronary arteries:  The coronary arteries  were examined.   LAD:  Large vessel that was diffusely diseased throughout the mid portion but distally there was minimal disease.  LCX:  No stenosis on cath  RCA:  PDA and PL were both large vessels with no distal disease.   Grafts:  1. LIMA to the LAD: 2.0 mm. It was sewn end to side using 8-0 prolene continuous suture. 2. Free RIMA to the PDA :  2.0 mm. It was sewn end to side using 8-0 prolene continuous suture.  The proximal RIMA anastomoses was performed to the mid-ascending aorta using continuous 7-0 prolene suture. A graft marker was placed around the proximal anastomosis.   Completion:  The patient was rewarmed to 37 degrees Centigrade. The clamp was removed from the LIMA pedicle and there was rapid warming of the septum and return of ventricular fibrillation. The crossclamp was removed with a time of 66 minutes. There was spontaneous return of sinus rhythm. The distal and proximal anastomoses were checked for hemostasis. The position of the grafts was satisfactory. Two temporary epicardial pacing wires were placed on the right atrium and two on the right ventricle. The patient was weaned from CPB without difficulty on no inotropes. CPB time was 90 minutes. Cardiac output was 5.5 LPM. TEE showed normal LV function. Heparin was fully reversed with protamine and the aortic and venous cannulas removed. Hemostasis was achieved. Mediastinal and left pleural drainage tubes were  placed. The sternum was closed with double #6 stainless steel wires. The fascia was closed with continuous # 1 vicryl suture. The subcutaneous tissue was closed with 2-0 vicryl continuous suture. The skin was closed with 3-0 vicryl subcuticular suture. All sponge, needle, and instrument counts were reported correct at the end of the case. Dry sterile dressings were placed over the incisions and around the chest tubes which were connected to pleurevac suction. The patient was then transported to the surgical intensive  care unit in critical but stable condition.

## 2016-11-01 NOTE — Progress Notes (Signed)
  Echocardiogram Echocardiogram Transesophageal has been performed.  Leta JunglingCooper, Rosette Bellavance M 11/01/2016, 10:09 AM

## 2016-11-01 NOTE — Anesthesia Procedure Notes (Signed)
Central Venous Catheter Insertion Performed by: Roderic Palau, anesthesiologist Start/End3/04/2017 8:20 AM, 11/01/2016 8:30 AM Patient location: Pre-op. Preanesthetic checklist: patient identified, IV checked, site marked, risks and benefits discussed, surgical consent, monitors and equipment checked, pre-op evaluation, timeout performed and anesthesia consent Position: Trendelenburg Lidocaine 1% used for infiltration and patient sedated Hand hygiene performed , maximum sterile barriers used  and Seldinger technique used Catheter size: 9 Fr Total catheter length 10. Central line and PA cath was placed.MAC introducer Swan type:thermodilution PA Cath depth:50 Procedure performed using ultrasound guided technique. Ultrasound Notes:anatomy identified, needle tip was noted to be adjacent to the nerve/plexus identified, no ultrasound evidence of intravascular and/or intraneural injection and image(s) printed for medical record Attempts: 1 Following insertion, line sutured and dressing applied. Post procedure assessment: blood return through all ports, free fluid flow and no air  Patient tolerated the procedure well with no immediate complications.

## 2016-11-01 NOTE — Progress Notes (Signed)
[facility] BRIEF SICU PROGRESS NOTE  Day of Surgery  S/P Procedure(s) (LRB): CORONARY ARTERY BYPASS GRAFTING (CABG) TIMES TWO USING BILATERAL INTERNAL MAMMARY ARTERIES (N/A) TRANSESOPHAGEAL ECHOCARDIOGRAM (TEE) (N/A)   Sedated on vent AV paced w/ stable hemodynamics on very low dose Neo drip O2 sats 100% Chest tube output low UOP adequate Labs okay  Plan: Continue routine early postop  Purcell Nailslarence H Owen, MD 11/01/2016 4:59 PM

## 2016-11-01 NOTE — Anesthesia Procedure Notes (Signed)
Procedure Name: Intubation Date/Time: 11/01/2016 9:42 AM Performed by: Gwenyth AllegraADAMI, Garcia Dalzell Pre-anesthesia Checklist: Patient identified, Emergency Drugs available, Suction available, Patient being monitored and Timeout performed Patient Re-evaluated:Patient Re-evaluated prior to inductionOxygen Delivery Method: Circle system utilized Preoxygenation: Pre-oxygenation with 100% oxygen Intubation Type: IV induction Ventilation: Mask ventilation without difficulty and Oral airway inserted - appropriate to patient size Grade View: Grade III Tube type: Oral Tube size: 7.5 mm Number of attempts: 3 Airway Equipment and Method: Video-laryngoscopy Placement Confirmation: ETT inserted through vocal cords under direct vision,  positive ETCO2 and breath sounds checked- equal and bilateral Secured at: 22 cm Tube secured with: Tape Dental Injury: Teeth and Oropharynx as per pre-operative assessment  Difficulty Due To: Difficult Airway- due to reduced neck mobility and Difficult Airway- due to anterior larynx

## 2016-11-02 ENCOUNTER — Encounter (HOSPITAL_COMMUNITY): Payer: Self-pay | Admitting: Thoracic Surgery (Cardiothoracic Vascular Surgery)

## 2016-11-02 ENCOUNTER — Inpatient Hospital Stay (HOSPITAL_COMMUNITY): Payer: Managed Care, Other (non HMO)

## 2016-11-02 DIAGNOSIS — Z951 Presence of aortocoronary bypass graft: Secondary | ICD-10-CM

## 2016-11-02 LAB — GLUCOSE, CAPILLARY
GLUCOSE-CAPILLARY: 100 mg/dL — AB (ref 65–99)
GLUCOSE-CAPILLARY: 105 mg/dL — AB (ref 65–99)
GLUCOSE-CAPILLARY: 112 mg/dL — AB (ref 65–99)
GLUCOSE-CAPILLARY: 120 mg/dL — AB (ref 65–99)
GLUCOSE-CAPILLARY: 97 mg/dL (ref 65–99)
Glucose-Capillary: 112 mg/dL — ABNORMAL HIGH (ref 65–99)
Glucose-Capillary: 112 mg/dL — ABNORMAL HIGH (ref 65–99)

## 2016-11-02 LAB — BASIC METABOLIC PANEL
ANION GAP: 7 (ref 5–15)
BUN: 11 mg/dL (ref 6–20)
CALCIUM: 8.3 mg/dL — AB (ref 8.9–10.3)
CO2: 23 mmol/L (ref 22–32)
Chloride: 109 mmol/L (ref 101–111)
Creatinine, Ser: 0.79 mg/dL (ref 0.61–1.24)
GFR calc Af Amer: >60 mL/min (ref >60)
GLUCOSE: 113 mg/dL — AB (ref 65–99)
POTASSIUM: 4.2 mmol/L (ref 3.5–5.1)
SODIUM: 139 mmol/L (ref 135–145)

## 2016-11-02 LAB — CBC
HCT: 33.7 % — ABNORMAL LOW (ref 39.0–52.0)
HCT: 35.1 % — ABNORMAL LOW (ref 39.0–52.0)
Hemoglobin: 11.1 g/dL — ABNORMAL LOW (ref 13.0–17.0)
Hemoglobin: 11.1 g/dL — ABNORMAL LOW (ref 13.0–17.0)
MCH: 29.3 pg (ref 26.0–34.0)
MCH: 28.7 pg (ref 26.0–34.0)
MCHC: 32.9 g/dL (ref 30.0–36.0)
MCHC: 31.6 g/dL (ref 30.0–36.0)
MCV: 88.9 fL (ref 78.0–100.0)
MCV: 90.7 fL (ref 78.0–100.0)
PLATELETS: 103 10*3/uL — AB (ref 150–400)
PLATELETS: 108 10*3/uL — AB (ref 150–400)
RBC: 3.79 MIL/uL — AB (ref 4.22–5.81)
RBC: 3.87 MIL/uL — AB (ref 4.22–5.81)
RDW: 14 % (ref 11.5–15.5)
RDW: 14.6 % (ref 11.5–15.5)
WBC: 13.2 10*3/uL — AB (ref 4.0–10.5)
WBC: 14 10*3/uL — AB (ref 4.0–10.5)

## 2016-11-02 LAB — MAGNESIUM
MAGNESIUM: 2.1 mg/dL (ref 1.7–2.4)
Magnesium: 2.1 mg/dL (ref 1.7–2.4)

## 2016-11-02 LAB — CREATININE, SERUM
CREATININE: 0.77 mg/dL (ref 0.61–1.24)
GFR calc Af Amer: >60 mL/min (ref >60)
GFR calc non Af Amer: >60 mL/min (ref >60)

## 2016-11-02 LAB — POCT I-STAT, CHEM 8
BUN: 13 mg/dL (ref 6–20)
CALCIUM ION: 1.23 mmol/L (ref 1.15–1.40)
Chloride: 104 mmol/L (ref 101–111)
Creatinine, Ser: 0.7 mg/dL (ref 0.61–1.24)
GLUCOSE: 113 mg/dL — AB (ref 65–99)
HCT: 33 % — ABNORMAL LOW (ref 39.0–52.0)
Hemoglobin: 11.2 g/dL — ABNORMAL LOW (ref 13.0–17.0)
Potassium: 3.8 mmol/L (ref 3.5–5.1)
SODIUM: 141 mmol/L (ref 135–145)
TCO2: 27 mmol/L (ref 0–100)

## 2016-11-02 MED ORDER — METOPROLOL TARTRATE 25 MG PO TABS
25.0000 mg | ORAL_TABLET | Freq: Two times a day (BID) | ORAL | Status: DC
  Administered 2016-11-03 – 2016-11-05 (×4): 25 mg via ORAL
  Filled 2016-11-02 (×5): qty 1

## 2016-11-02 MED ORDER — FUROSEMIDE 10 MG/ML IJ SOLN
20.0000 mg | Freq: Once | INTRAMUSCULAR | Status: AC
  Administered 2016-11-02: 20 mg via INTRAVENOUS
  Filled 2016-11-02: qty 2

## 2016-11-02 NOTE — Progress Notes (Addendum)
[address]       [place] [phone]             [phone]        CARDIOTHORACIC SURGERY PROGRESS NOTE   R1 Day Post-Op Procedure(s) (LRB): CORONARY ARTERY BYPASS GRAFTING (CABG) TIMES TWO USING BILATERAL INTERNAL MAMMARY ARTERIES (N/A) TRANSESOPHAGEAL ECHOCARDIOGRAM (TEE) (N/A)  Subjective: Feels sore in chest.  Otherwise doing fine.  Objective: Vital signs: BP Readings from Last 1 Encounters:  11/02/16 110/71   Pulse Readings from Last 1 Encounters:  11/02/16 76   Resp Readings from Last 1 Encounters:  11/02/16 18   Temp Readings from Last 1 Encounters:  11/02/16 98.6 F (37 C)    Hemodynamics: PAP: (15-32)/(7-22) 21/10 CO:  [3.7 L/min-7.7 L/min] 7.7 L/min CI:  [1.8 L/min/m2-3.8 L/min/m2] 3.8 L/min/m2  Physical Exam:  Rhythm:   sinus  Breath sounds: Scattered rhonchi  Heart sounds:  RRR  Incisions:  Dressings dry, intact  Abdomen:  Soft, non-distended, non-tender  Extremities:  Warm, well-perfused  Chest tubes:  low volume thin serosanguinous output, no air leak    Intake/Output from previous day: 03/09 0701 - 03/10 0700 In: 4152.8 [I.V.:3570.8; Blood:252; NG/GT:30; IV Piggyback:300] Out: 6008 [Urine:3435; Emesis/NG output:50; Blood:2133; Chest Tube:390] Intake/Output this shift: No intake/output data recorded.  Lab Results:  CBC: Recent Labs  11/01/16 2200 11/01/16 2220 11/02/16 0455  WBC 14.9*  --  13.2*  HGB 11.6* 11.6* 11.1*  HCT 35.4* 34.0* 33.7*  PLT 108*  --  103*    BMET:  Recent Labs  11/01/16 0526  11/01/16 2220 11/02/16 0455  NA 141  < > 143 139  K 3.9  < > 4.3 4.2  CL 108  < > 110 109  CO2 26  --   --  23  GLUCOSE 94  < > 149* 113*  BUN 14  < > 15 11  CREATININE 0.80  < > 0.70 0.79  CALCIUM 9.5  --   --  8.3*  < > = values in this interval not displayed.   PT/INR:   Recent Labs  11/01/16 1605  LABPROT 18.4*  INR 1.51    CBG (last 3)   Recent Labs  11/02/16 0035 11/02/16 0426  11/02/16 0837  GLUCAP 105* 100* 97    ABG    Component Value Date/Time   PHART 7.342 (L) 11/01/2016 2255   PCO2ART 41.2 11/01/2016 2255   PO2ART 123.0 (H) 11/01/2016 2255   HCO3 22.2 11/01/2016 2255   TCO2 23 11/01/2016 2255   ACIDBASEDEF 3.0 (H) 11/01/2016 2255   O2SAT 98.0 11/01/2016 2255    CXR:  PORTABLE CHEST 1 VIEW  COMPARISON:  Chest x-ray dated 11/01/2016.  FINDINGS: Endotracheal tube and enteric tube have been removed. Swan-Ganz catheter is stable in position with tip approximating the midline. Bilateral chest tubes appear stable in position. Mediastinal drain is stable in position. Heart size and mediastinal contours appear stable.  Probable atelectasis and small effusion at the left lung base. Right lung is clear. No pneumothorax seen.  IMPRESSION: 1. Interval removal of the endotracheal and nasogastric tubes. Remainder of the support apparatus remains appropriately positioned, as detailed above. 2. Probable mild atelectasis and small effusion at the left lung base. Lungs appear otherwise clear.   Electronically Signed   By: Bary RichardStan  Maynard M.D.   On: 11/02/2016 09:11   EKG: NSR w/out acute ischemic changes, slight diffuse ST segment elevation likely due to pericardial changes  Assessment/Plan: S/P Procedure(s) (LRB): CORONARY ARTERY BYPASS GRAFTING (CABG) TIMES TWO USING BILATERAL INTERNAL MAMMARY ARTERIES (N/A) TRANSESOPHAGEAL ECHOCARDIOGRAM (TEE) (N/A)  Doing well POD1 Maintaining NSR w/ stable hemodynamics, no drips Breathing comfortably w/ O2 sats 98% on RA Expected post op acute blood loss anemia, mild Expected post op atelectasis, mild   Mobilize  Increase metoprolol  Restart ACE-I in 1-2 days if BP will allow  D/C tubes and lines  Routine early postop   Purcell Nails, MD 11/02/2016 9:17 AM

## 2016-11-02 NOTE — Plan of Care (Signed)
Problem: Activity: Goal: Risk for activity intolerance will decrease Outcome: Progressing Pt to be OOB in AM  Problem: Bowel/Gastric: Goal: Gastrointestinal status for postoperative course will improve Outcome: Progressing Tolerating clears  Problem: Cardiac: Goal: Hemodynamic stability will improve Outcome: Progressing Within parameters on no gtt's Goal: Ability to maintain an adequate cardiac output will improve Outcome: Progressing Within parameters Goal: Will show no signs and symptoms of excessive bleeding Outcome: Progressing Within parameters  Problem: Respiratory: Goal: Respiratory status will improve Outcome: Progressing 4LNC Goal: Ability to tolerate decreased levels of ventilator support will improve Outcome: Completed/Met Date Met: 11/02/16 Extubated 11/01/16

## 2016-11-02 NOTE — Progress Notes (Signed)
TCTS BRIEF SICU PROGRESS NOTE  1 Day Post-Op  S/P Procedure(s) (LRB): CORONARY ARTERY BYPASS GRAFTING (CABG) TIMES TWO USING BILATERAL INTERNAL MAMMARY ARTERIES (N/A) TRANSESOPHAGEAL ECHOCARDIOGRAM (TEE) (N/A)   Doing well.  Feels much better since tubes out NSR w/ stable BP Breathing comfortably w/ O2 sats  91-99% on 3 L/min UOP adequate  Plan: Continue current plans  Purcell Nailslarence H Maeola Mchaney, MD 11/02/2016 5:54 PM

## 2016-11-03 ENCOUNTER — Inpatient Hospital Stay (HOSPITAL_COMMUNITY): Payer: Managed Care, Other (non HMO)

## 2016-11-03 LAB — BASIC METABOLIC PANEL
Anion gap: 4 — ABNORMAL LOW (ref 5–15)
BUN: 13 mg/dL (ref 6–20)
CO2: 28 mmol/L (ref 22–32)
Calcium: 8.6 mg/dL — ABNORMAL LOW (ref 8.9–10.3)
Chloride: 104 mmol/L (ref 101–111)
Creatinine, Ser: 0.77 mg/dL (ref 0.61–1.24)
GFR calc Af Amer: >60 mL/min (ref >60)
GFR calc non Af Amer: >60 mL/min (ref >60)
Glucose, Bld: 131 mg/dL — ABNORMAL HIGH (ref 65–99)
Potassium: 3.8 mmol/L (ref 3.5–5.1)
SODIUM: 136 mmol/L (ref 135–145)

## 2016-11-03 LAB — CBC
HCT: 33.6 % — ABNORMAL LOW (ref 39.0–52.0)
Hemoglobin: 10.5 g/dL — ABNORMAL LOW (ref 13.0–17.0)
MCH: 28.5 pg (ref 26.0–34.0)
MCHC: 31.3 g/dL (ref 30.0–36.0)
MCV: 91.1 fL (ref 78.0–100.0)
PLATELETS: 94 10*3/uL — AB (ref 150–400)
RBC: 3.69 MIL/uL — ABNORMAL LOW (ref 4.22–5.81)
RDW: 14.4 % (ref 11.5–15.5)
WBC: 14.1 10*3/uL — ABNORMAL HIGH (ref 4.0–10.5)

## 2016-11-03 LAB — GLUCOSE, CAPILLARY
GLUCOSE-CAPILLARY: 110 mg/dL — AB (ref 65–99)
Glucose-Capillary: 134 mg/dL — ABNORMAL HIGH (ref 65–99)

## 2016-11-03 MED ORDER — CLOPIDOGREL BISULFATE 75 MG PO TABS
75.0000 mg | ORAL_TABLET | Freq: Every day | ORAL | Status: DC
  Administered 2016-11-03 – 2016-11-05 (×3): 75 mg via ORAL
  Filled 2016-11-03 (×3): qty 1

## 2016-11-03 MED ORDER — PANTOPRAZOLE SODIUM 40 MG PO TBEC
40.0000 mg | DELAYED_RELEASE_TABLET | Freq: Every day | ORAL | Status: DC
  Administered 2016-11-04 – 2016-11-05 (×2): 40 mg via ORAL
  Filled 2016-11-03 (×3): qty 1

## 2016-11-03 MED ORDER — SODIUM CHLORIDE 0.9 % IV SOLN
30.0000 meq | Freq: Once | INTRAVENOUS | Status: AC
  Administered 2016-11-03: 30 meq via INTRAVENOUS
  Filled 2016-11-03 (×2): qty 15

## 2016-11-03 MED ORDER — ASPIRIN EC 81 MG PO TBEC
81.0000 mg | DELAYED_RELEASE_TABLET | Freq: Every day | ORAL | Status: DC
  Administered 2016-11-04 – 2016-11-05 (×2): 81 mg via ORAL
  Filled 2016-11-03 (×2): qty 1

## 2016-11-03 MED ORDER — SODIUM CHLORIDE 0.9% FLUSH
3.0000 mL | Freq: Two times a day (BID) | INTRAVENOUS | Status: DC
  Administered 2016-11-03 – 2016-11-05 (×3): 3 mL via INTRAVENOUS

## 2016-11-03 MED ORDER — SODIUM CHLORIDE 0.9 % IV SOLN: 250.0000 mL | INTRAVENOUS | Status: DC | PRN

## 2016-11-03 MED ORDER — FUROSEMIDE 40 MG PO TABS
40.0000 mg | ORAL_TABLET | Freq: Every day | ORAL | Status: DC
  Administered 2016-11-03 – 2016-11-05 (×3): 40 mg via ORAL
  Filled 2016-11-03 (×3): qty 1

## 2016-11-03 MED ORDER — MOVING RIGHT ALONG BOOK
Freq: Once | Status: AC
  Administered 2016-11-03: 11:00:00
  Filled 2016-11-03: qty 1

## 2016-11-03 MED ORDER — SODIUM CHLORIDE 0.9% FLUSH: 3.0000 mL | INTRAVENOUS | Status: DC | PRN

## 2016-11-03 MED ORDER — POTASSIUM CHLORIDE CRYS ER 20 MEQ PO TBCR
20.0000 meq | EXTENDED_RELEASE_TABLET | Freq: Every day | ORAL | Status: DC
  Administered 2016-11-03 – 2016-11-05 (×3): 20 meq via ORAL
  Filled 2016-11-03 (×3): qty 1

## 2016-11-03 NOTE — Progress Notes (Signed)
[address]       [place] [id number]             [phone]        CARDIOTHORACIC SURGERY PROGRESS NOTE   R2 Days Post-Op Procedure(s) (LRB): CORONARY ARTERY BYPASS GRAFTING (CABG) TIMES TWO USING BILATERAL INTERNAL MAMMARY ARTERIES (N/A) TRANSESOPHAGEAL ECHOCARDIOGRAM (TEE) (N/A)  Subjective: Feels okay but didn't sleep much.  Mild soreness in chest.   Denies SOB  Objective: Vital signs: BP Readings from Last 1 Encounters:  11/03/16 115/64   Pulse Readings from Last 1 Encounters:  11/03/16 67   Resp Readings from Last 1 Encounters:  11/03/16 12   Temp Readings from Last 1 Encounters:  11/03/16 98.9 F (37.2 C) (Oral)    Hemodynamics:    Physical Exam:  Rhythm:   sinus  Breath sounds: clear  Heart sounds:  RRR  Incisions:  Dressings dry, intact  Abdomen:  Soft, non-distended, non-tender  Extremities:  Warm, well-perfused   Intake/Output from previous day: 03/10 0701 - 03/11 0700 In: 820 [P.O.:720; IV Piggyback:100] Out: 1660 [Urine:1650; Chest Tube:10] Intake/Output this shift: Total I/O In: 110 [I.V.:60; IV Piggyback:50] Out: 100 [Urine:100]  Lab Results:  CBC: Recent Labs  11/02/16 1747 11/02/16 1801 11/03/16 0330  WBC 14.0*  --  14.1*  HGB 11.1* 11.2* 10.5*  HCT 35.1* 33.0* 33.6*  PLT 108*  --  94*    BMET:  Recent Labs  11/02/16 0455  11/02/16 1801 11/03/16 0330  NA 139  --  141 136  K 4.2  --  3.8 3.8  CL 109  --  104 104  CO2 23  --   --  28  GLUCOSE 113*  --  113* 131*  BUN 11  --  13 13  CREATININE 0.79  < > 0.70 0.77  CALCIUM 8.3*  --   --  8.6*  < > = values in this interval not displayed.   PT/INR:   Recent Labs  11/01/16 1605  LABPROT 18.4*  INR 1.51    CBG (last 3)   Recent Labs  11/02/16 2323 11/03/16 0316 11/03/16 0843  GLUCAP 112* 134* 110*    ABG    Component Value Date/Time   PHART 7.342 (L) 11/01/2016 2255   PCO2ART 41.2 11/01/2016 2255   PO2ART 123.0 (H) 11/01/2016  2255   HCO3 22.2 11/01/2016 2255   TCO2 27 11/02/2016 1801   ACIDBASEDEF 3.0 (H) 11/01/2016 2255   O2SAT 98.0 11/01/2016 2255    CXR: PORTABLE CHEST 1 VIEW  COMPARISON:  Chest radiograph from one day prior.  FINDINGS: Stable configuration of intact appearing median sternotomy wires, surgical hardware from ACDF overlying the lower cervical spine and surgical clips overlying the left chest and right upper abdomen. Right internal jugular central venous sheath terminates in the right upper mediastinum. Stable cardiomediastinal silhouette with mild cardiomegaly. No pneumothorax. New trace right and stable small left pleural effusions. Low lung volumes. Patchy bibasilar lung opacities, increased on the right and stable on the left.  IMPRESSION: 1. No pneumothorax. 2. New trace right pleural effusion. Stable small left pleural effusion. 3. Patchy bibasilar lung opacities, increased on the right and stable on the left, favor atelectasis.   Electronically Signed   By: Delbert PhenixJason A Poff M.D.   On: 11/03/2016 08:26  Assessment/Plan: S/P Procedure(s) (LRB): CORONARY ARTERY BYPASS GRAFTING (CABG) TIMES TWO USING BILATERAL INTERNAL MAMMARY ARTERIES (N/A) TRANSESOPHAGEAL ECHOCARDIOGRAM (TEE) (N/A)  Doing well POD2 Maintaining NSR w/  stable BP Breathing comfortably w/ O2 sats 98% on 2 L/min Expected post op acute blood loss anemia, mild, stable Expected post op atelectasis, mild, stable Expected post op volume excess, mild   Mobilize  Gentle diuresis  Restart ACE-I in 1-2 days if BP will allow  Transfer 2W  Purcell Nails, MD 11/03/2016 10:39 AM

## 2016-11-03 NOTE — Progress Notes (Signed)
Pt transferred to 2W17 via wheelchair; RN and NT in room upon transfer; pt chart, SCD's and belongings along with transfer.  Ricky Hughes, Mimi Debellis A

## 2016-11-03 NOTE — Progress Notes (Signed)
Patient arrived from 2S on a wheelchair, assessment completed see flowsheet, patient oriented to room and staff, placed on tele ccmd notified, bed in lowest position, call bell within reach will continue to monitor

## 2016-11-04 ENCOUNTER — Inpatient Hospital Stay (HOSPITAL_COMMUNITY): Payer: Managed Care, Other (non HMO)

## 2016-11-04 ENCOUNTER — Encounter (HOSPITAL_COMMUNITY): Payer: Self-pay | Admitting: Surgery

## 2016-11-04 LAB — CBC
HCT: 32.3 % — ABNORMAL LOW (ref 39.0–52.0)
Hemoglobin: 10.3 g/dL — ABNORMAL LOW (ref 13.0–17.0)
MCH: 28.7 pg (ref 26.0–34.0)
MCHC: 31.9 g/dL (ref 30.0–36.0)
MCV: 90 fL (ref 78.0–100.0)
PLATELETS: 101 10*3/uL — AB (ref 150–400)
RBC: 3.59 MIL/uL — AB (ref 4.22–5.81)
RDW: 14.1 % (ref 11.5–15.5)
WBC: 13.1 10*3/uL — ABNORMAL HIGH (ref 4.0–10.5)

## 2016-11-04 LAB — BASIC METABOLIC PANEL
Anion gap: 6 (ref 5–15)
BUN: 12 mg/dL (ref 6–20)
CALCIUM: 8.7 mg/dL — AB (ref 8.9–10.3)
CO2: 28 mmol/L (ref 22–32)
Chloride: 102 mmol/L (ref 101–111)
Creatinine, Ser: 0.87 mg/dL (ref 0.61–1.24)
GLUCOSE: 123 mg/dL — AB (ref 65–99)
POTASSIUM: 4 mmol/L (ref 3.5–5.1)
SODIUM: 136 mmol/L (ref 135–145)

## 2016-11-04 MED ORDER — LACTULOSE 10 GM/15ML PO SOLN
20.0000 g | Freq: Once | ORAL | Status: AC
  Administered 2016-11-04: 20 g via ORAL
  Filled 2016-11-04: qty 30

## 2016-11-04 NOTE — Plan of Care (Signed)
Problem: Bowel/Gastric: Goal: Gastrointestinal status for postoperative course will improve Outcome: Progressing Provided with lactulose today

## 2016-11-04 NOTE — Progress Notes (Signed)
CARDIAC REHAB PHASE I   PRE:  Rate/Rhythm: 76 SR  BP:  Supine:   Sitting: 112/54  Standing:    SaO2: 89%RA  MODE:  Ambulation: 300 ft   POST:  Rate/Rhythm: 75 SR  BP:  Supine:   Sitting: 118/68  Standing:    SaO2: 94%RA 1342-1412 Pt stated he has been walking to bathroom frequently and was tired. Encouraged him to walk. Pt walked 300 ft with rolling walker and minimal asst with steady gait. Tolerated well. Stopped once to rest. Back to recliner with feet up. Appreciative of walk.   Luetta Nuttingharlene Mattia Liford, RN BSN  11/04/2016 2:08 PM

## 2016-11-04 NOTE — Progress Notes (Addendum)
      [address]       [place] [id number]             [phone]        3 Days Post-Op Procedure(s) (LRB): CORONARY ARTERY BYPASS GRAFTING (CABG) TIMES TWO USING BILATERAL INTERNAL MAMMARY ARTERIES (N/A) TRANSESOPHAGEAL ECHOCARDIOGRAM (TEE) (N/A)  Subjective: Patient with constipation.  Objective: Vital signs in last 24 hours: Temp:  [97.6 F (36.4 C)-98.9 F (37.2 C)] 97.6 F (36.4 C) (03/12 0515) Pulse Rate:  [64-86] 64 (03/12 0515) Cardiac Rhythm: Normal sinus rhythm (03/11 2009) Resp:  [12-29] 20 (03/12 0515) BP: (99-146)/(60-76) 122/70 (03/12 0515) SpO2:  [86 %-100 %] 100 % (03/12 0515) Weight:  [88.5 kg (195 lb 1.6 oz)] 88.5 kg (195 lb 1.6 oz) (03/12 0515)  Pre op weight 83.5 kg Current Weight  11/04/16 88.5 kg (195 lb 1.6 oz)      Intake/Output from previous day: 03/11 0701 - 03/12 0700 In: 615 [P.O.:120; I.V.:180; IV Piggyback:315] Out: 675 [Urine:675]   Physical Exam:  Cardiovascular: RRR Pulmonary: Slightly diminished at bases Abdomen: Soft, non tender, bowel sounds present. Extremities: Mild bilateral lower extremity edema. Wounds: Clean and dry.  No erythema or signs of infection.  Lab Results: CBC: Recent Labs  11/03/16 0330 11/04/16 0149  WBC 14.1* 13.1*  HGB 10.5* 10.3*  HCT 33.6* 32.3*  PLT 94* 101*   BMET:  Recent Labs  11/03/16 0330 11/04/16 0149  NA 136 136  K 3.8 4.0  CL 104 102  CO2 28 28  GLUCOSE 131* 123*  BUN 13 12  CREATININE 0.77 0.87  CALCIUM 8.6* 8.7*    PT/INR:  Lab Results  Component Value Date   INR 1.51 11/01/2016   INR 1.07 10/25/2016   ABG:  INR: Will add last result for INR, ABG once components are confirmed Will add last 4 CBG results once components are confirmed  Assessment/Plan:  1. CV - SR in the 70's this am.On Lopressor 25 mg bid and Plavix 75 mg daily. Hope to restart ACE in am if BP allows. 2.  Pulmonary - On 2 liters via [place]. Wean to room air as able. Encourage  incentive spirometer. 3. Volume Overload - On Lasix 40 mg daily 4.  Acute blood loss anemia - H and H stable at 10.3 and 32.3 5. Mild thrombocytopenia-platelets up to 101,000 6. LOC constipation 7. Remove EPW  ZIMMERMAN,DONIELLE MPA-C 11/04/2016,7:51 AM   Chart reviewed, patient examined, agree with above. He is doing fairly well. Still sore and tired but improving. He is ambulating well. Probably home Wednesday.

## 2016-11-04 NOTE — Discharge Instructions (Signed)
Coronary Artery Bypass Grafting, Care After ° °This sheet gives you information about how to care for yourself after your procedure. Your health care provider may also give you more specific instructions. If you have problems or questions, contact your health care provider. °What can I expect after the procedure? °After the procedure, it is common to have: °· Nausea and a lack of appetite. °· Constipation. °· Weakness and fatigue. °· Depression or irritability. °· Pain or discomfort in your incision areas. °Follow these instructions at home: °Medicines  °· Take over-the-counter and prescription medicines only as told by your health care provider. Do not stop taking medicines or start any new medicines without approval from your health care provider. °· If you were prescribed an antibiotic medicine, take it as told by your health care provider. Do not stop taking the antibiotic even if you start to feel better. °· Do not drive or use heavy machinery while taking prescription pain medicine. °Incision care  °· Follow instructions from your health care provider about how to take care of your incisions. Make sure you: °¨ Wash your hands with soap and water before you change your bandage (dressing). If soap and water are not available, use hand sanitizer. °¨ Change your dressing as told by your health care provider. °¨ Leave stitches (sutures), skin glue, or adhesive strips in place. These skin closures may need to stay in place for 2 weeks or longer. If adhesive strip edges start to loosen and curl up, you may trim the loose edges. Do not remove adhesive strips completely unless your health care provider tells you to do that. °· Keep incision areas clean, dry, and protected. °· Check your incision areas every day for signs of infection. Check for: °¨ More redness, swelling, or pain. °¨ More fluid or blood. °¨ Warmth. °¨ Pus or a bad smell. °· If incisions were made in your legs: °¨ Avoid crossing your legs. °¨ Avoid  sitting for long periods of time. Change positions every 30 minutes. °¨ Raise (elevate) your legs when you are sitting. °Bathing  °· Do not take baths, swim, or use a hot tub until your health care provider approves. °· Only take sponge baths. Pat the incisions dry. Do not rub incisions with a washcloth or towel. °· Ask your health care provider when you can shower. °Eating and drinking  °· Eat foods that are high in fiber, such as raw fruits and vegetables, whole grains, beans, and nuts. Meats should be lean cut. Avoid canned, processed, and fried foods. This can help prevent constipation and is a recommended part of a heart-healthy diet. °· Drink enough fluid to keep your urine clear or pale yellow. °· Limit alcohol intake to no more than 1 drink a day for nonpregnant women and 2 drinks a day for men. One drink equals 12 oz of beer, 5 oz of wine, or 1½ oz of hard liquor. °Activity  °· Rest and limit your activity as told by your health care provider. You may be instructed to: °¨ Stop any activity right away if you have chest pain, shortness of breath, irregular heartbeats, or dizziness. Get help right away if you have any of these symptoms. °¨ Move around frequently for short periods or take short walks as directed by your health care provider. Gradually increase your activities. You may need physical therapy or cardiac rehabilitation to help strengthen your muscles and build your endurance. °¨ Avoid lifting, pushing, or pulling anything that is heavier than 10   lb (4.5 kg) for at least 6 weeks or as told by your health care provider. °· Do not drive until your health care provider approves. °· Ask your health care provider when you may return to work. °· Ask your health care provider when you may resume sexual activity. °General instructions  °· Do not use any products that contain nicotine or tobacco, such as cigarettes and e-cigarettes. If you need help quitting, ask your health care provider. °· Take 2-3 deep  breaths every few hours during the day, while you recover. This helps expand your lungs and prevent complications like pneumonia after surgery. °· If you were given a device called an incentive spirometer, use it several times a day to practice deep breathing. Support your chest with a pillow or your arms when you take deep breaths or cough. °· Wear compression stockings as told by your health care provider. These stockings help to prevent blood clots and reduce swelling in your legs. °· Weigh yourself every day. This helps identify if your body is holding (retaining) fluid that may make your heart and lungs work harder. °· Keep all follow-up visits as told by your health care provider. This is important. °Contact a health care provider if: °· You have more redness, swelling, or pain around any incision. °· You have more fluid or blood coming from any incision. °· Any incision feels warm to the touch. °· You have pus or a bad smell coming from any incision °· You have a fever. °· You have swelling in your ankles or legs. °· You have pain in your legs. °· You gain 2 lb (0.9 kg) or more a day. °· You are nauseous or you vomit. °· You have diarrhea. °Get help right away if: °· You have chest pain that spreads to your jaw or arms. °· You are short of breath. °· You have a fast or irregular heartbeat. °· You notice a "clicking" in your breastbone (sternum) when you move. °· You have numbness or weakness in your arms or legs. °· You feel dizzy or light-headed. °Summary °· After the procedure, it is common to have pain or discomfort in the incision areas. °· Do not take baths, swim, or use a hot tub until your health care provider approves. °· Gradually increase your activities. You may need physical therapy or cardiac rehabilitation to help strengthen your muscles and build your endurance. °· Weigh yourself every day. This helps identify if your body is holding (retaining) fluid that may make your heart and lungs work  harder. °This information is not intended to replace advice given to you by your health care provider. Make sure you discuss any questions you have with your health care provider. °Document Released: 03/01/2005 Document Revised: 07/01/2016 Document Reviewed: 07/01/2016 °Elsevier Interactive Patient Education © 2017 [employer] ° °

## 2016-11-04 NOTE — Progress Notes (Signed)
Removed epicardial pacing wires. Patient tolerated well. Sites cleansed. Steri strips applied. Vitals WNL. Bed rest for one hour. Will continue to monitor.  Minerva Endsiffany N Darryl Willner RN

## 2016-11-04 NOTE — Discharge Summary (Signed)
Physician Discharge Summary  Patient ID: Ricky Hughes MRN: [id number] DOB/AGE: 63-Jul-1957 61 y.o.  Admit date: 10/25/2016 Discharge date: 11/05/2016  Admission Diagnoses: Patient Active Problem List   Diagnosis Date Noted  . Abnormal nuclear stress test   . Unstable angina (HCC)   . History of peptic ulcer disease 10/25/2016  . Hypertension 10/25/2016  . Hyperlipidemia 10/25/2016  . Chest pain 04/21/2012  . Coronary atherosclerosis of native coronary artery 04/21/2012    Discharge Diagnoses:  Principal Problem:   Chest pain Active Problems:   Coronary artery disease involving native heart without angina pectoris   History of peptic ulcer disease   Hypertension   Hyperlipidemia   Unstable angina (HCC)   Abnormal nuclear stress test   S/P CABG x 2   Discharged Condition: good  HPI:  The patient is a 61 year old gentleman with hypertension and coronary disease s/p PCI with stenting of the LAD in 2010. He was cathed in 2013 showing a patent stent in the LAD with 50-60% stenosis beyond the stent, 20-30% OM stenosis and mild RCA disease. He was managed medically. He reports a 1-2 year history of exertional fatigue and shortness of breath, left chest pressure type pain radiating to back. The symptoms were occasional at first but now occurring more frequently and more severe. He has had some pain at rest. He has been taking some NTG at home and trying to ignore it. He was at work and developed pain in the left chest and neck, felt weak in legs and sat down. He had some associated N/V. He laid on the floor and his coworkers brought him to the ER. He ruled out for MI but had a myoview on 10/26/2016 showing an old IMI with peri-infarct ischemia. He had some more CP since admission and underwent cath this am showing patent LAD stent with 90% mid LAD stenosis just beyond the stent. The RCA is occluded with left to right collat to PDA and PL. LVEF normal with mild MR. LVEDP moderately elevated at  25.    Hospital Course:  Ricky Hughes underwent a coronary artery bypass grafting x 2 on 11/01/2016 with Dr. Laneta Simmers. He tolerated the procedure well and was transferred to the ICU. He was extubated in a timely manner. POD 1 we increased his beta blocker for increased blood pressure and heart rate control. He was on no drips and we were able to discontinue chest tubes and lines. He did have some expected blood loss anemia. POD 2 he continued to progress. He was requiring only 2L [place] for support. We initiated some gentle diuretics. We transferred him to the step-down unit. POD 3 he remained in NSR. We started his Plavix and continued to titrate his beta blocker. We continued to encourage incentive spirometry in hopes of weaning his oxygen requirements. EPW were removed on 11/04/2016. He is now ambulating on room air. He has been tolerating a diet and has had a bowel movement. His wounds are clean and dry and continuing to heal. He is felt surgically stable for discharge today.  Consults: None  Significant Diagnostic Studies:  CLINICAL DATA:  Three days postoperative from CABG  EXAM: CHEST  2 VIEW  COMPARISON:  Portable chest x-ray of November 03, 2016  FINDINGS: There remains hypoinflation on the right. A right pleural effusion with basilar atelectasis or pneumonia persists. The retrocardiac region on the left is clearer and there is partial visualization of the left hemidiaphragm. There is no pneumothorax. The cardiac silhouette  remains enlarged. The pulmonary vascularity is not engorged. The sternal wires are intact. The retrosternal soft tissues exhibit no acute abnormalities. There has been interval removal of the right internal jugular [place] sheath. The observed bony thorax is unremarkable. The visualized portions of the upper abdomen are normal.  IMPRESSION: Decreasing left lower lobe atelectasis. Persistent small left pleural effusion. Increasing right lower lobe atelectasis  with stable small right pleural effusion. No pneumothorax.  Cardiomegaly without pulmonary vascular congestion.   Electronically Signed   By: David  [place] M.D.   On: 11/04/2016 07:53   Treatments:  CARDIOVASCULAR SURGERY OPERATIVE NOTE  11/01/2016  Surgeon:  Alleen Borne, MD  First Assistant: Gershon Crane, [facility]   Preoperative Diagnosis:  Severe multi-vessel coronary artery disease   Postoperative Diagnosis:  Same   Procedure:  1. Median Sternotomy 2. Extracorporeal circulation 3.   Coronary artery bypass grafting x 2   Left internal mammary graft to the LAD  Free right internal mammary graft to the PDA  Discharge Exam: Blood pressure 131/72, pulse 80, temperature 98.6 F (37 C), temperature source Oral, resp. rate 18, height 6' (1.829 m), weight 86.5 kg (190 lb 12.8 oz), SpO2 94 %.   Cardiovascular: RRR Pulmonary: Slightly diminished at bases Abdomen: Soft, non tender, bowel sounds present. Extremities: Mild bilateral lower extremity edema. Wounds: Clean and dry.  No erythema or signs of infection.   Disposition: 01-Home or Self Care   Allergies as of 11/05/2016      Reactions   No Known Allergies       Medication List    STOP taking these medications   metoprolol succinate 25 MG 24 hr tablet Commonly known as:  TOPROL-XL   nitroGLYCERIN 0.4 MG SL tablet Commonly known as:  NITROSTAT     TAKE these medications   aspirin 81 MG tablet Take 1 tablet (81 mg total) by mouth daily.   atorvastatin 40 MG tablet Commonly known as:  LIPITOR Take 40 mg by mouth daily.   clopidogrel 75 MG tablet Commonly known as:  PLAVIX Take 75 mg by mouth daily.   furosemide 40 MG tablet Commonly known as:  LASIX Take 1 tablet (40 mg total) by mouth daily. For 5 days then stop   lisinopril 2.5 MG tablet Commonly known as:  PRINIVIL,ZESTRIL Take 1 tablet (2.5 mg total) by mouth daily. What changed:  medication strength  how much to take    metoprolol tartrate 25 MG tablet Commonly known as:  LOPRESSOR Take 1 tablet (25 mg total) by mouth 2 (two) times daily.   omeprazole 20 MG capsule Commonly known as:  PRILOSEC Take 20 mg by mouth daily.   oxyCODONE 5 MG immediate release tablet Commonly known as:  Oxy IR/ROXICODONE Take 5 mg by mouth every 4-6 hours PRN severe pain   potassium chloride SA 20 MEQ tablet Commonly known as:  K-DUR,KLOR-CON Take 1 tablet (20 mEq total) by mouth daily. For 5 days then stop.       The patient has been discharged on:   1.Beta Blocker:  Yes [ x  ]                              No   [   ]                              If No, reason:  2.Ace  Inhibitor/ARB: Yes [ x  ]                                     No  [    ]                                     If No, reason:  3.Statin:   Yes [ x  ]                  No  [   ]                  If No, reason:  4.Ecasa:  Yes  [ x ]                  No   [   ]                  If No, reason:  Follow-up Information    HUNGARLAND,JOHN DAVID, MD. Call in 1 day(s).   Specialty:  Family Medicine Contact information: [address] [place] [phone]        Tereso NewcomerScott Weaver, PA-C Follow up.   Specialties:  Cardiology, Physician Assistant Why:  [facility] [address] location - 11/19/16 at 8:45am Contact information: [address] [place] [address] [phone]        Alleen BorneBryan K Bartle, MD Follow up on 12/04/2016.   Specialty:  Cardiothoracic Surgery Why:  PA/LAT CXR to be taken ([facility] which is in the same building as Dr. Sharee PimpleBartle's office) on 12/11/2016 at 10:30 ZH:[id number]:Appointment time is at 11:00 am Contact information: [address]  [place] [address] [phone]        Nurse Follow up on 11/12/2016.   Why:  Appointment is for chest tube suture removal only. Appointment time is at 10:00 am Contact information: [address] [place] [address]              Signed: Ardelle BallsZIMMERMAN,Normon Pettijohn M PA-C 11/05/2016, 10:50 AM

## 2016-11-05 MED ORDER — OXYCODONE HCL 5 MG PO TABS: ORAL_TABLET | ORAL | 0 refills | Status: DC

## 2016-11-05 MED ORDER — FUROSEMIDE 40 MG PO TABS: 40.0000 mg | ORAL_TABLET | Freq: Every day | ORAL | 0 refills | Status: DC

## 2016-11-05 MED ORDER — POTASSIUM CHLORIDE CRYS ER 20 MEQ PO TBCR: 20.0000 meq | EXTENDED_RELEASE_TABLET | Freq: Every day | ORAL | 0 refills | Status: DC

## 2016-11-05 MED ORDER — LISINOPRIL 2.5 MG PO TABS: 2.5000 mg | ORAL_TABLET | Freq: Every day | ORAL | 1 refills | Status: DC

## 2016-11-05 MED ORDER — METOPROLOL TARTRATE 25 MG PO TABS: 25.0000 mg | ORAL_TABLET | Freq: Two times a day (BID) | ORAL | 1 refills | Status: DC

## 2016-11-05 MED ORDER — LISINOPRIL 2.5 MG PO TABS
2.5000 mg | ORAL_TABLET | Freq: Every day | ORAL | Status: DC
  Administered 2016-11-05: 2.5 mg via ORAL
  Filled 2016-11-05: qty 1

## 2016-11-05 MED FILL — Heparin Sodium (Porcine) Inj 1000 Unit/ML: INTRAMUSCULAR | Qty: 10 | Status: AC

## 2016-11-05 MED FILL — Electrolyte-R (PH 7.4) Solution: INTRAVENOUS | Qty: 4000 | Status: AC

## 2016-11-05 MED FILL — Sodium Chloride IV Soln 0.9%: INTRAVENOUS | Qty: 2000 | Status: AC

## 2016-11-05 MED FILL — Lidocaine HCl IV Inj 20 MG/ML: INTRAVENOUS | Qty: 5 | Status: AC

## 2016-11-05 MED FILL — Mannitol IV Soln 20%: INTRAVENOUS | Qty: 500 | Status: AC

## 2016-11-05 MED FILL — Sodium Bicarbonate IV Soln 8.4%: INTRAVENOUS | Qty: 50 | Status: AC

## 2016-11-05 NOTE — Progress Notes (Addendum)
      [address]       [place] [id number]             [phone]        4 Days Post-Op Procedure(s) (LRB): CORONARY ARTERY BYPASS GRAFTING (CABG) TIMES TWO USING BILATERAL INTERNAL MAMMARY ARTERIES (N/A) TRANSESOPHAGEAL ECHOCARDIOGRAM (TEE) (N/A)  Subjective: Patient had bowel movement. He is not sleeping much. He wants to go home.  Objective: Vital signs in last 24 hours: Temp:  [98.6 F (37 C)-98.7 F (37.1 C)] 98.6 F (37 C) (03/13 0454) Pulse Rate:  [67-87] 77 (03/13 0454) Cardiac Rhythm: Normal sinus rhythm (03/12 1900) Resp:  [18] 18 (03/13 0454) BP: (111-134)/(53-68) 134/68 (03/13 0454) SpO2:  [94 %-97 %] 94 % (03/13 0454) Weight:  [86.5 kg (190 lb 12.8 oz)] 86.5 kg (190 lb 12.8 oz) (03/13 0454)  Pre op weight 83.5 kg Current Weight  11/05/16 86.5 kg (190 lb 12.8 oz)      Intake/Output from previous day: 03/12 0701 - 03/13 0700 In: -  Out: 300 [Urine:300]   Physical Exam:  Cardiovascular: RRR Pulmonary: Slightly diminished at bases Abdomen: Soft, non tender, bowel sounds present. Extremities: Mild bilateral lower extremity edema. Wounds: Clean and dry.  No erythema or signs of infection.  Lab Results: CBC:  Recent Labs  11/03/16 0330 11/04/16 0149  WBC 14.1* 13.1*  HGB 10.5* 10.3*  HCT 33.6* 32.3*  PLT 94* 101*   BMET:   Recent Labs  11/03/16 0330 11/04/16 0149  NA 136 136  K 3.8 4.0  CL 104 102  CO2 28 28  GLUCOSE 131* 123*  BUN 13 12  CREATININE 0.77 0.87  CALCIUM 8.6* 8.7*    PT/INR:  Lab Results  Component Value Date   INR 1.51 11/01/2016   INR 1.07 10/25/2016   ABG:  INR: Will add last result for INR, ABG once components are confirmed Will add last 4 CBG results once components are confirmed  Assessment/Plan:  1. CV - SR in the 80's this am.On Lopressor 25 mg bid and Plavix 75 mg daily. Will start low dose ACE. 2.  Pulmonary - On room air. Encourage incentive spirometer. 3. Volume Overload - On  Lasix 40 mg daily 4.  Acute blood loss anemia - H and H stable at 10.3 and 32.3 5. Mild thrombocytopenia-platelets up to 101,000 6. Chest tube sutures to remain 7. Will discuss disposition with Dr. Laneta SimmersBartle.  ZIMMERMAN,DONIELLE MPA-C 11/05/2016,7:20 AM   Chart reviewed, patient examined, agree with above. He looks good overall and has not had any arrhythmias.  Weight is at admission weight but 7lbs over preop. I think he can go home. I would keep him on lasix for a few more days.

## 2016-11-05 NOTE — Care Management Note (Signed)
Case Management Note Previous CM note initiated by Glennon MacAmerson, Julie M, RN 10/31/2016, 4:41 PM    Patient Details  Name: Ricky Hughes MRN: [id number] Date of Birth: [birth year]  Subjective/Objective:   Pt admitted on 10/25/16 with 2 vessel CAD; planning CABG on 11/01/16.  PTA, pt independent, lives with spouse.                   Action/Plan: Will follow post op for discharge planning.   Expected Discharge Date:  11/05/16               Expected Discharge Plan:  Home/Self Care  In-House Referral:     Discharge planning Services  CM Consult  Post Acute Care Choice:  NA Choice offered to:  NA  DME Arranged:    DME Agency:     HH Arranged:    HH Agency:     Status of Service:  Completed, signed off  If discussed at Long Length of Stay Meetings, dates discussed:  3/13  Discharge Disposition: home/self care   Additional Comments:  11/05/16- 1210- Donn PieriniKristi Rhianon Zabawa RN, CM- pt for d/c home today- no CM needs noted for discharge  Darrold SpanWebster, Mazikeen Hehn Hall, RN 11/05/2016, 12:12 PM [phone]

## 2016-11-07 ENCOUNTER — Encounter: Payer: Self-pay | Admitting: Physician Assistant

## 2016-11-07 ENCOUNTER — Other Ambulatory Visit: Payer: Self-pay | Admitting: *Deleted

## 2016-11-07 DIAGNOSIS — E781 Pure hyperglyceridemia: Secondary | ICD-10-CM

## 2016-11-07 DIAGNOSIS — I2510 Atherosclerotic heart disease of native coronary artery without angina pectoris: Secondary | ICD-10-CM

## 2016-11-07 MED ORDER — ATORVASTATIN CALCIUM 40 MG PO TABS: 40.0000 mg | ORAL_TABLET | Freq: Every day | ORAL | 1 refills | Status: DC

## 2016-11-07 MED ORDER — CLOPIDOGREL BISULFATE 75 MG PO TABS: 75.0000 mg | ORAL_TABLET | Freq: Every day | ORAL | 1 refills | Status: DC

## 2016-11-07 NOTE — Progress Notes (Signed)
CARDIAC REHAB PHASE I   PRE:  Rate/Rhythm: 75 SR   BP:  Supine:   Sitting: 130/66  Standing:    SaO2: 96%RA  MODE:  Ambulation: 690 ft   POST:  Rate/Rhythm: 85 SR  BP:  Supine:   Sitting: 130/70  Standing:    SaO2: 93%RA 1030-1115 Pt walked 690 ft on RA with steady gait and tolerated well. Education completed with pt who voiced understanding. Had seen discharge video. Has walker at home if needed. [facility] Pt was seen on 11/05/16.   Luetta Nuttingharlene Sueko Dimichele, RN BSN  11/07/2016 7:20 AM

## 2016-11-10 LAB — VAS US DOPPLER PRE CABG
LEFT ECA DIAS: -15 cm/s
LEFT VERTEBRAL DIAS: -16 cm/s
Left CCA dist dias: -17 cm/s
Left CCA dist sys: -78 cm/s
Left CCA prox dias: 19 cm/s
Left CCA prox sys: 94 cm/s
Left ICA dist dias: -10 cm/s
Left ICA dist sys: -35 cm/s
Left ICA prox dias: -19 cm/s
Left ICA prox sys: -73 cm/s
RCCADSYS: -30 cm/s
RCCAPDIAS: 12 cm/s
RIGHT ECA DIAS: -12 cm/s
RIGHT VERTEBRAL DIAS: -14 cm/s
Right CCA prox sys: 95 cm/s

## 2016-11-18 NOTE — Progress Notes (Signed)
Cardiology Office Note:    Date:  11/19/2016   ID:  Ricky Hughes, DOB 08-01-1956, MRN [id number]  PCP:  Maximiano Coss, MD  Cardiologist:  Dr. Delane Ginger    Electrophysiologist:  n/a  Referring MD: Maximiano Coss   Chief Complaint  Patient presents with  . Hospitalization Follow-up    s/p CABG    History of Present Illness:    Ricky Hughes is a 61 y.o. male with a hx of CAD s/p prior stenting to the LAD in 2010, HTN, HL.   He was admitted 3/2-3/13 with unstable angina.  Myocardial Perfusion Imaging Study demonstrated old IMI with peri-infarct ischemia.  He underwent LHC which demonstrated 2 v CAD with patent LAD stent, distal LAD stenosis and CTO of the RCA.  He was referred to [facility] and underwent CABG with Dr. Laneta Simmers (L-LAD, free RIMA-PDA).  Post op course was fairly uneventful.  He remained in NSR.    He returns for Cardiology follow up.  He is here today with his wife. Since DC, he has been doing fairly well. His appetite is poor. He does have a mild cough with clear sputum. He denies hemoptysis. Denies fever. He has had some right clavicular and right flank pain. This seems to have begun since coming home. It seems to be mainly related to how he has been sleeping on the couch. It is getting better since he has started sleeping in the bed. He has not been walking much given the recent poor weather. He denies orthopnea, PND or edema. He denies syncope. He has been dizzy at times. Sternal wound pain is gradually improving. He did have some nausea and vomiting yesterday.  Prior CV studies:   The following studies were reviewed today:  TEE 11/01/16  Left ventricle: Normal cavity size and wall thickness. LV systolic function is normal with an EF of 55-60%. There are no obvious wall motion abnormalities.  Aortic valve: The valve is trileaflet. No stenosis. Trace regurgitation.  Aorta: The aortic root is dilated at the sinuses of Valsalva.  Mitral valve: No  leaflet thickening and calcification present. Trace regurgitation.  Right ventricle: Normal cavity size, wall thickness and ejection fraction.  Carotid US 3/18 L 1-39  LHC 10/28/16 LAD ost stent ok, dist 90 LCx with OM1 30 RCA prox 100 with L-R collats EF 55-65, 2+ MR  Past Medical History:  Diagnosis Date  . Carotid stenosis    pre CABG [place] 4/09: 1-39% LICA   . Coronary artery disease    s/p PCI to LAD 2010; Cath 8/13: 1v CAD w/ patent stent in mLAD & mod non flow limiting disease beyond the stented segment, mild non-obstructive dz in LCx and RCA, nl LV systolic fxn, no evidence of aortic dissection // LHC 3/18: oLAD stent ok, dLAD 90, OM1 30, pRCA 100 with L-R collats, EF 55-66 >> CABG  . GERD (gastroesophageal reflux disease)   . HLD (hyperlipidemia)   . Hypertension   . Ulcer [facility])     Past Surgical History:  Procedure Laterality Date  . CARDIAC CATHETERIZATION    . CERVICAL SPINE SURGERY    . CHOLECYSTECTOMY    . CORONARY ANGIOPLASTY WITH STENT PLACEMENT    . CORONARY ARTERY BYPASS GRAFT N/A 11/01/2016   Procedure: CORONARY ARTERY BYPASS GRAFTING (CABG) TIMES TWO USING BILATERAL INTERNAL MAMMARY ARTERIES;  Surgeon: Alleen Borne, MD;  Location: [facility] OR;  Service: Open Heart Surgery;  Laterality: N/A;  . LEFT HEART CATH AND CORONARY  ANGIOGRAPHY N/A 10/28/2016   Procedure: Left Heart Cath and Coronary Angiography;  Surgeon: Marykay Lexavid W Harding, MD;  [facility];  Service: Cardiovascular;  Laterality: N/A;  . LEFT HEART CATHETERIZATION WITH CORONARY ANGIOGRAM N/A 04/20/2012   Procedure: LEFT HEART CATHETERIZATION WITH CORONARY ANGIOGRAM;  Surgeon: Kathleene Hazelhristopher D McAlhany, MD;  [facility];  Service: Cardiovascular;  Laterality: N/A;  . TEE WITHOUT CARDIOVERSION N/A 11/01/2016   Procedure: TRANSESOPHAGEAL ECHOCARDIOGRAM (TEE);  Surgeon: Alleen BorneBryan K Bartle, MD;  [facility];  Service: Open Heart Surgery;  Laterality: N/A;    Current Medications: Current Meds    Medication Sig  . aspirin 81 MG tablet Take 1 tablet (81 mg total) by mouth daily.  . clopidogrel (PLAVIX) 75 MG tablet Take 1 tablet (75 mg total) by mouth daily.  . [DISCONTINUED] atorvastatin (LIPITOR) 40 MG tablet Take 1 tablet (40 mg total) by mouth daily.  . [DISCONTINUED] lisinopril (PRINIVIL,ZESTRIL) 2.5 MG tablet Take 1 tablet (2.5 mg total) by mouth daily.  . [DISCONTINUED] metoprolol tartrate (LOPRESSOR) 25 MG tablet Take 1 tablet (25 mg total) by mouth 2 (two) times daily.  . [DISCONTINUED] omeprazole (PRILOSEC) 20 MG capsule Take 20 mg by mouth daily.     Allergies:   No known allergies   Social History   Social History  . Marital status: Married    Spouse name: N/A  . Number of children: N/A  . Years of education: N/A   Occupational History  . Emergency planning/management officerroject manager    Social History Main Topics  . Smoking status: Never Smoker  . Smokeless tobacco: Never Used  . Alcohol use Yes     Comment: rare  . Drug use: No  . Sexual activity: Yes   Other Topics Concern  . None   Social History Narrative  . None     Family History  Problem Relation Age of Onset  . Heart attack Mother 6558    Deid age 63  . Heart attack Father 3865  . CAD Brother 7368    CABG     ROS:   Please see the history of present illness.    Review of Systems  Musculoskeletal: Positive for joint pain and muscle cramps.  Neurological: Positive for headaches.   All other systems reviewed and are negative.   EKGs/Labs/Other Test Reviewed:    EKG:  EKG is  ordered today.  The ekg ordered today demonstrates NSR, HR 64, normal axis, T-wave inversions in 3, aVF, ? septal Q waves, QTc 406 ms  Recent Labs: 10/25/2016: ALT 20 11/02/2016: Magnesium 2.1 11/04/2016: BUN 12; Creatinine, Ser 0.87; Hemoglobin 10.3; Platelets 101; Potassium 4.0; Sodium 136   Recent Lipid Panel    Component Value Date/Time   CHOL 143 10/26/2016 0232   TRIG 41 10/26/2016 0232   HDL 54 10/26/2016 0232   CHOLHDL 2.6 10/26/2016  0232   VLDL 8 10/26/2016 0232   LDLCALC 81 10/26/2016 0232     Physical Exam:    VS:  BP 98/60   Pulse 64   Ht 6' (1.829 m)   Wt 180 lb 12.8 oz (82 kg)   BMI 24.52 kg/m     Wt Readings from Last 3 Encounters:  11/19/16 180 lb 12.8 oz (82 kg)  11/05/16 190 lb 12.8 oz (86.5 kg)  04/21/12 189 lb 13.1 oz (86.1 kg)     Physical Exam  Constitutional: He is oriented to person, place, and time. He appears well-developed and well-nourished. No distress.  HENT:  Head: Normocephalic and atraumatic.  Eyes: No scleral icterus.  Neck: Normal range of motion. No JVD present.  Cardiovascular: Normal rate, regular rhythm, S1 normal and S2 normal.   No murmur heard. Pulmonary/Chest: Effort normal. He has decreased breath sounds. He has no wheezes. He has no rhonchi. He has no rales.  Median sternotomy well healed without erythema or d/c; R lower ribs and R clavicle without deformity  Abdominal: Soft. There is no hepatomegaly. There is tenderness (very mild over lower rib) in the right upper quadrant.  Musculoskeletal: He exhibits no edema.  Neurological: He is alert and oriented to person, place, and time.  Skin: Skin is warm and dry.  Psychiatric: He has a normal mood and affect.    ASSESSMENT:    1. Coronary artery disease involving native coronary artery of native heart without angina pectoris   2. Essential hypertension   3. Pure hypercholesterolemia    PLAN:    In order of problems listed above:  1. Coronary artery disease involving native coronary artery of native heart without angina pectoris -  s/p CABG. He is recovering slowly. He does have some discomfort over his lower ribs on the right as well as his right clavicle. This seems to be more related to positional changes. I recommended heat to the areas. He can call us if the symptoms do not improve or worsen. His appetite is poor and I have recommended Ensure or Boost to supplement his diet until his appetite improves.    -   Continue ASA, Plavix, statin, beta blocker  -  Refer to [facility] in [place]  -  FU with Dr. Laneta Simmers as planned.   2. Essential hypertension -  BP is s/w low. DC Lisinopril for now.  Reduced Metoprolol to 12.5 mg bid.   3. Pure hypercholesterolemia - With CAD and recent CABG, LDL should be < 70.  Will increase Lipitor to 80 QD. Check Lipids and LFTs in 6 weeks.   Dispo:  Return in about 3 months (around 02/19/2017) for Routine Follow Up, w/ Dr. Elease Hashimoto.   Medication Adjustments/Labs and Tests Ordered: Current medicines are reviewed at length with the patient today.  Concerns regarding medicines are outlined above.  Medication changes, Labs and Tests ordered today are outlined in the Patient Instructions noted below. Patient Instructions  Medication Instructions:  1. STOP LISINOPRIL UNTIL FURTHER ADVISED CARDIOLOGY 2. INCREASE LIPITOR TO 80 MG DAILY; NEW RX HAS BEEN SENT IN 3. DECREASE METOPROLOL TARTRATE TO 12.5 MG TWICE DAILY (THIS WILL BE 1/2 TABLET TWICE DAILY)  Labwork: YOU HAVE BEEN GIVEN AN RX TO HAVE FASTING LIPID AND LIVER PANEL DONE IN 6 WEEKS WITH YOUR PRIMARY CARE OFFICE; PLEASE HAVE THE RESULTS FAXED TO [place], [facility] [phone]  Testing/Procedures: NONE ORDERED  Follow-Up: DR. Elease Hashimoto IN 2-3 MONTHS   Any Other Special Instructions Will Be Listed Below (If Applicable). 1. CARDIAC REHAB IN [place] AT [facility]; I WILL CALL THE HOSPITAL TO PLACE THE ORDER 2. DRINK ENSURE/BOOST IF YOU CANNOT EAT A FULL MEAL UNTIL APPETITE IS BETTER. 3. CALL IF YOUR RIGHT SIDE PAIN DOES NOT GET BETTER; YOU CAN USE HEAT 1-2 TIMES A DAY FOR OVER THE NEXT FEW DAYS; LIMIT HEAT TO ABOUT 15-20 MINUTES EACH TIME  If you need a refill on your cardiac medications before your next appointment, please call your pharmacy.  Signed, Tereso Newcomer, PA-C  11/19/2016 9:42 AM    [facility] [address] [place], [place],  [place]  [address] Phone: [phone]; Fax: [phone]

## 2016-11-19 ENCOUNTER — Encounter (INDEPENDENT_AMBULATORY_CARE_PROVIDER_SITE_OTHER): Payer: Self-pay

## 2016-11-19 ENCOUNTER — Ambulatory Visit (INDEPENDENT_AMBULATORY_CARE_PROVIDER_SITE_OTHER): Payer: Managed Care, Other (non HMO) | Admitting: Physician Assistant

## 2016-11-19 ENCOUNTER — Ambulatory Visit (INDEPENDENT_AMBULATORY_CARE_PROVIDER_SITE_OTHER): Payer: Self-pay | Admitting: *Deleted

## 2016-11-19 ENCOUNTER — Encounter: Payer: Self-pay | Admitting: Physician Assistant

## 2016-11-19 VITALS — BP 98/60 | HR 64 | Ht 72.0 in | Wt 180.8 lb

## 2016-11-19 DIAGNOSIS — I2510 Atherosclerotic heart disease of native coronary artery without angina pectoris: Secondary | ICD-10-CM | POA: Diagnosis not present

## 2016-11-19 DIAGNOSIS — Z4802 Encounter for removal of sutures: Secondary | ICD-10-CM

## 2016-11-19 DIAGNOSIS — E7800 Pure hypercholesterolemia, unspecified: Secondary | ICD-10-CM | POA: Diagnosis not present

## 2016-11-19 DIAGNOSIS — Z951 Presence of aortocoronary bypass graft: Secondary | ICD-10-CM

## 2016-11-19 DIAGNOSIS — I10 Essential (primary) hypertension: Secondary | ICD-10-CM

## 2016-11-19 MED ORDER — METOPROLOL TARTRATE 25 MG PO TABS: 12.5000 mg | ORAL_TABLET | Freq: Two times a day (BID) | ORAL | 3 refills | Status: DC

## 2016-11-19 MED ORDER — ATORVASTATIN CALCIUM 80 MG PO TABS: 80.0000 mg | ORAL_TABLET | Freq: Every day | ORAL | 3 refills | Status: DC

## 2016-11-19 NOTE — Progress Notes (Signed)
Mr. Ricky Hughes returns s/p CABG X 2 on 11/01/16 for suture removal from four previous chest/pleural tubes. These were easily removed. All operative sites are healing very well. He has no issues with diet or bowels. There is no LE edema. He will return as scheduled with a CXR. He has already seen cardiology today.

## 2016-11-19 NOTE — Patient Instructions (Addendum)
Medication Instructions:  1. STOP LISINOPRIL UNTIL FURTHER ADVISED CARDIOLOGY 2. INCREASE LIPITOR TO 80 MG DAILY; NEW RX HAS BEEN SENT IN 3. DECREASE METOPROLOL TARTRATE TO 12.5 MG TWICE DAILY (THIS WILL BE 1/2 TABLET TWICE DAILY)  Labwork: YOU HAVE BEEN GIVEN AN RX TO HAVE FASTING LIPID AND LIVER PANEL DONE IN 6 WEEKS WITH YOUR PRIMARY CARE OFFICE; PLEASE HAVE THE RESULTS FAXED TO [place], [facility] [phone]  Testing/Procedures: NONE ORDERED  Follow-Up: DR. Elease HashimotoNAHSER IN 2-3 MONTHS   Any Other Special Instructions Will Be Listed Below (If Applicable). 1. CARD[facility]AC REHAB IN [place] [facility]; I WILL CALL THE HOSPITAL TO PLACE THE ORDER 2. DRINK ENSURE/BOOST IF YOU CANNOT EAT A FULL MEAL UNTIL APPETITE IS BETTER. 3. CALL IF YOUR RIGHT SIDE PAIN DOES NOT GET BETTER; YOU CAN USE HEAT 1-2 TIMES A DAY FOR OVER THE NEXT FEW DAYS; LIMIT HEAT TO ABOUT 15-20 MINUTES EACH TIME  If you need a refill on your cardiac medications before your next appointment, please call your pharmacy.

## 2016-12-10 ENCOUNTER — Other Ambulatory Visit: Payer: Self-pay | Admitting: Surgery

## 2016-12-10 DIAGNOSIS — Z951 Presence of aortocoronary bypass graft: Secondary | ICD-10-CM

## 2016-12-11 ENCOUNTER — Ambulatory Visit
Admission: RE | Admit: 2016-12-11 | Discharge: 2016-12-11 | Disposition: A | Discharge status: Home / Self Care | Payer: Managed Care, Other (non HMO) | Source: Ambulatory Visit | Attending: Surgery | Admitting: Surgery

## 2016-12-11 ENCOUNTER — Ambulatory Visit (INDEPENDENT_AMBULATORY_CARE_PROVIDER_SITE_OTHER): Payer: Self-pay | Admitting: Surgery

## 2016-12-11 ENCOUNTER — Encounter: Payer: Self-pay | Admitting: Surgery

## 2016-12-11 VITALS — BP 127/80 | HR 64 | Resp 20 | Ht 72.0 in | Wt 180.0 lb

## 2016-12-11 DIAGNOSIS — I2510 Atherosclerotic heart disease of native coronary artery without angina pectoris: Secondary | ICD-10-CM

## 2016-12-11 DIAGNOSIS — Z951 Presence of aortocoronary bypass graft: Secondary | ICD-10-CM

## 2016-12-11 NOTE — Progress Notes (Signed)
      HPI: Patient returns for routine postoperative follow-up having undergone CABG x 2 with bilateral IMA grafts on 11/01/2016. The patient's early postoperative recovery while in the hospital was notable for an uncomplicated postop course. Since hospital discharge the patient reports that he was seen by cardiology and his lisinopril was stopped and Lopressor dose reduced due to low BP and fatigue. He has been feeling much better since. He is walking without chest pain or shortness of breath but his stamina is not back to normal yet. He has mild incisional soreness.   Current Outpatient Prescriptions  Medication Sig Dispense Refill  . aspirin 81 MG tablet Take 1 tablet (81 mg total) by mouth daily.    Marland Kitchen atorvastatin (LIPITOR) 80 MG tablet Take 1 tablet (80 mg total) by mouth daily. 90 tablet 3  . clopidogrel (PLAVIX) 75 MG tablet Take 1 tablet (75 mg total) by mouth daily. 30 tablet 1  . metoprolol tartrate (LOPRESSOR) 25 MG tablet Take 0.5 tablets (12.5 mg total) by mouth 2 (two) times daily. 180 tablet 3   No current facility-administered medications for this visit.     Physical Exam: BP 127/80   Pulse 64   Resp 20   Ht 6' (1.829 m)   Wt 180 lb (81.6 kg)   SpO2 99% Comment: RA  BMI 24.41 kg/m  He looks well. Lung exam is clear. Cardiac exam shows a regular rate and rhythm with normal heart sounds. Chest incision is healing well and sternum is stable. The leg incisions are healing well and there is no peripheral edema.   Diagnostic Tests:  CLINICAL DATA:  Status post CABG 11/01/2016.  Chest soreness.  EXAM: CHEST  2 VIEW  COMPARISON:  PA and lateral chest 11/04/2016.  FINDINGS: Right pleural effusion has markedly decreased since the prior examination. There is no left effusion. Mild right basilar atelectasis is noted. There is cardiomegaly without edema. Seven intact median sternotomy wires are unchanged.  IMPRESSION: Marked decrease in right pleural effusion  since the prior examination. No new abnormality.   Electronically Signed   By: Drusilla Kanner M.D.   On: 12/11/2016 10:37   Impression:  Overall I think he is doing well. I encouraged him to continue walking. He is planning to participate in cardiac rehab in [place]. I told him he could drive his car but should not lift anything heavier than 10 lbs for three months postop. He has a physically strenuous job and will not be able to return for 3 months postop. I gave him a note for this today.   Plan:  He has a follow up appt with Dr. Elease Hashimoto in June and will return to see me if he has any problems with his incisions.   Alleen Borne, MD [facility] [phone]

## 2017-01-28 ENCOUNTER — Telehealth: Payer: Self-pay | Admitting: *Deleted

## 2017-01-28 ENCOUNTER — Encounter: Payer: Self-pay | Admitting: Cardiovascular Disease

## 2017-01-28 ENCOUNTER — Ambulatory Visit (INDEPENDENT_AMBULATORY_CARE_PROVIDER_SITE_OTHER): Payer: Managed Care, Other (non HMO) | Admitting: Cardiovascular Disease

## 2017-01-28 ENCOUNTER — Encounter (INDEPENDENT_AMBULATORY_CARE_PROVIDER_SITE_OTHER): Payer: Self-pay

## 2017-01-28 VITALS — BP 124/76 | HR 78 | Ht 72.0 in | Wt 182.5 lb

## 2017-01-28 DIAGNOSIS — E782 Mixed hyperlipidemia: Secondary | ICD-10-CM

## 2017-01-28 DIAGNOSIS — I25110 Atherosclerotic heart disease of native coronary artery with unstable angina pectoris: Secondary | ICD-10-CM | POA: Diagnosis not present

## 2017-01-28 NOTE — Telephone Encounter (Signed)
-----   Message from Scott T Weaver, PA-C sent at 01/28/2017  5:05 PM EDT ----- Please call the patient Labs from PCP 01/2017 reviewed: LDL 58, Creatinine 0.93, K 4.3, ALT 15 Lipids at goal. LFTs ok. Continue with current treatment plan. Scott Weaver, PA-C   01/28/2017 5:05 PM 

## 2017-01-28 NOTE — Patient Instructions (Signed)
Medication Instructions:  Your physician recommends that you continue on your current medications as directed. Please refer to the Current Medication list given to you today.   Labwork: None Ordered   Testing/Procedures: None Ordered   Follow-Up: Your physician recommends that you schedule a follow-up appointment in: 3 months with Dr. Nahser   If you need a refill on your cardiac medications before your next appointment, please call your pharmacy.   Thank you for choosing [facility]! Kenyonna Micek, RN [phone]    

## 2017-01-28 NOTE — Telephone Encounter (Signed)
Tried to call pt to go over lab results. No answer on cell #. I then tried the home listed and man answered who said wrong #.

## 2017-01-28 NOTE — Progress Notes (Signed)
Cardiology Office Note:    Date:  01/28/2017   ID:  Ricky Hughes, DOB 1956-02-10, MRN [id number]  PCP:  Yvone Neu, MD  Cardiologist:  Mertie Moores, MD    Referring MD: Yvone Neu   Problem list 1. Coronary artery disease-status post coronary artery bypass graft 2. Hypertension 3. Hyperlipidemia  Chief Complaint  Patient presents with  . Coronary Artery Disease    History of Present Illness:    Ricky Hughes is a 61 y.o. male with a hx of CAD I met him in the hospital in March when he presented with CP Cath showed tight stenosis of LAD and RCA.  He had CABG on March 9.  He is eager to return to work  No angina .  Has the usual chest soreness.   Lipids from primary MD Chol - 128 HDL - 57 Trig - 51 LDL - 58   Past Medical History:  Diagnosis Date  . Carotid stenosis    pre CABG [place] 5/36: 4-68% LICA   . Coronary artery disease    s/p PCI to LAD 2010; Cath 8/13: 1v CAD w/ patent stent in mLAD & mod non flow limiting disease beyond the stented segment, mild non-obstructive dz in LCx and RCA, nl LV systolic fxn, no evidence of aortic dissection // LHC 3/18: oLAD stent ok, dLAD 90, OM1 30, pRCA 100 with L-R collats, EF 55-66 >> CABG  . GERD (gastroesophageal reflux disease)   . HLD (hyperlipidemia)   . Hypertension   . Ulcer     Past Surgical History:  Procedure Laterality Date  . CARDIAC CATHETERIZATION    . CERVICAL SPINE SURGERY    . CHOLECYSTECTOMY    . CORONARY ANGIOPLASTY WITH STENT PLACEMENT    . CORONARY ARTERY BYPASS GRAFT N/A 11/01/2016   Procedure: CORONARY ARTERY BYPASS GRAFTING (CABG) TIMES TWO USING BILATERAL INTERNAL MAMMARY ARTERIES;  Surgeon: Gaye Pollack, MD;  Location: [place];  Service: Open Heart Surgery;  Laterality: N/A;  . LEFT HEART CATH AND CORONARY ANGIOGRAPHY N/A 10/28/2016   Procedure: Left Heart Cath and Coronary Angiography;  Surgeon: Leonie Man, MD;  Location: [place] [facility];  Service: Cardiovascular;   Laterality: N/A;  . LEFT HEART CATHETERIZATION WITH CORONARY ANGIOGRAM N/A 04/20/2012   Procedure: LEFT HEART CATHETERIZATION WITH CORONARY ANGIOGRAM;  Surgeon: Burnell Blanks, MD;  Location: [facility] CATH LAB;  Service: Cardiovascular;  Laterality: N/A;  . TEE WITHOUT CARDIOVERSION N/A 11/01/2016   Procedure: TRANSESOPHAGEAL ECHOCARDIOGRAM (TEE);  Surgeon: Gaye Pollack, MD;  Location: [place];  Service: Open Heart Surgery;  Laterality: N/A;    Current Medications: Current Meds  Medication Sig  . aspirin 81 MG tablet Take 1 tablet (81 mg total) by mouth daily.  Marland Kitchen atorvastatin (LIPITOR) 80 MG tablet Take 1 tablet (80 mg total) by mouth daily.  . clopidogrel (PLAVIX) 75 MG tablet Take 1 tablet (75 mg total) by mouth daily.  . metoprolol tartrate (LOPRESSOR) 25 MG tablet Take 0.5 tablets (12.5 mg total) by mouth 2 (two) times daily.     Allergies:   No known allergies   Social History   Social History  . Marital status: Married    Spouse name: N/A  . Number of children: N/A  . Years of education: N/A   Occupational History  . Government social research officer    Social History Main Topics  . Smoking status: Never Smoker  . Smokeless tobacco: Never Used  . Alcohol use Yes  Comment: rare  . Drug use: No  . Sexual activity: Yes   Other Topics Concern  . None   Social History Narrative  . None     Family History: The patient's family history includes CAD (age of onset: 29) in his brother; Heart attack (age of onset: 43) in his mother; Heart attack (age of onset: 40) in his father. ROS:   Please see the history of present illness.     All other systems reviewed and are negative.  EKGs/Labs/Other Studies Reviewed:    The following studies were reviewed today:   EKG:    Recent Labs: 10/25/2016: ALT 20 11/02/2016: Magnesium 2.1 11/04/2016: BUN 12; Creatinine, Ser 0.87; Hemoglobin 10.3; Platelets 101; Potassium 4.0; Sodium 136   Recent Lipid Panel    Component Value Date/Time   CHOL  143 10/26/2016 0232   TRIG 41 10/26/2016 0232   HDL 54 10/26/2016 0232   CHOLHDL 2.6 10/26/2016 0232   VLDL 8 10/26/2016 0232   LDLCALC 81 10/26/2016 0232    Physical Exam:    VS:  BP 124/76   Pulse 78   Ht 6' (1.829 m)   Wt 182 lb 8 oz (82.8 kg)   SpO2 97%   BMI 24.75 kg/m     Wt Readings from Last 3 Encounters:  01/28/17 182 lb 8 oz (82.8 kg)  12/11/16 180 lb (81.6 kg)  11/19/16 180 lb 12.8 oz (82 kg)     GEN:  Well nourished, well developed in no acute distress HEENT: Normal NECK: No JVD; No carotid bruits LYMPHATICS: No lymphadenopathy CARDIAC: RRR, no murmurs, rubs, gallops RESPIRATORY:  Clear to auscultation without rales, wheezing or rhonchi  ABDOMEN: Soft, non-tender, non-distended MUSCULOSKELETAL:  No edema; No deformity  SKIN: Warm and dry NEUROLOGIC:  Alert and oriented x 3 PSYCHIATRIC:  Normal affect   ASSESSMENT:    No diagnosis found. PLAN:    In order of problems listed above:  1. CAD :   He status post coronary artery bypass grafting. He's doing very well. He has been released to return to work as of June 11.  2. Hyperlipidemia :  Continue current dose of atorvastatin. Check labs in 6 months .   Medication Adjustments/Labs and Tests Ordered: Current medicines are reviewed at length with the patient today.  Concerns regarding medicines are outlined above. Labs and tests ordered and medication changes are outlined in the patient instructions below:  There are no Patient Instructions on file for this visit.   Signed, Mertie Moores, MD  01/28/2017 4:03 PM    [facility]

## 2017-01-29 ENCOUNTER — Encounter: Payer: Self-pay | Admitting: *Deleted

## 2017-01-29 NOTE — Telephone Encounter (Signed)
Not able to reach pt to go over lab results. I will mail out letter to pt to call for results. I called x 2 and when I called [phone] woman answered and told me I had the wrong #.

## 2017-01-29 NOTE — Telephone Encounter (Signed)
-----   Message from Beatrice LecherScott T Weaver, [place]-C sent at 01/28/2017  5:05 PM EDT ----- Please call the patient Labs from PCP 01/2017 reviewed: LDL 58, Creatinine 0.93, K 4.3, ALT 15 Lipids at goal. LFTs ok. Continue with current treatment plan. Tereso NewcomerScott Weaver, PA-C   01/28/2017 5:05 PM

## 2017-02-02 ENCOUNTER — Other Ambulatory Visit: Payer: Self-pay | Admitting: Surgery

## 2017-02-02 DIAGNOSIS — I2510 Atherosclerotic heart disease of native coronary artery without angina pectoris: Secondary | ICD-10-CM

## 2017-02-07 ENCOUNTER — Telehealth: Payer: Self-pay | Admitting: Physician Assistant

## 2017-02-07 NOTE — Telephone Encounter (Signed)
New message    Pt wife is calling asking for a call back. She said someone called last week about results and mailed a letter.

## 2017-02-10 NOTE — Telephone Encounter (Signed)
DPR on file for pt's wife who has been notified of lab results for the pt. I stated to pt's wife I sent a letter because the # [phone] I called on 01/28/17 woman answered and said I had the wrong #. I verified with pt's  Wife if this is a valid phone # for pt and she said no. I will take this [phone] out of the pt' s chart. Wife aware lab work looks good and for pt to continue on his current Tx plan. Pt's wife thanked me for my call today and help.

## 2017-02-22 ENCOUNTER — Other Ambulatory Visit: Payer: Self-pay | Admitting: Surgery

## 2017-02-22 ENCOUNTER — Other Ambulatory Visit: Payer: Self-pay | Admitting: Physician Assistant

## 2017-02-22 DIAGNOSIS — I2510 Atherosclerotic heart disease of native coronary artery without angina pectoris: Secondary | ICD-10-CM

## 2017-03-04 ENCOUNTER — Other Ambulatory Visit: Payer: Self-pay | Admitting: Surgery

## 2017-03-04 DIAGNOSIS — I2510 Atherosclerotic heart disease of native coronary artery without angina pectoris: Secondary | ICD-10-CM

## 2017-04-05 ENCOUNTER — Other Ambulatory Visit: Payer: Self-pay | Admitting: Physician Assistant

## 2017-04-05 DIAGNOSIS — I2510 Atherosclerotic heart disease of native coronary artery without angina pectoris: Secondary | ICD-10-CM

## 2017-05-06 ENCOUNTER — Encounter (INDEPENDENT_AMBULATORY_CARE_PROVIDER_SITE_OTHER): Payer: Self-pay

## 2017-05-06 ENCOUNTER — Ambulatory Visit (INDEPENDENT_AMBULATORY_CARE_PROVIDER_SITE_OTHER): Payer: Managed Care, Other (non HMO) | Admitting: Cardiovascular Disease

## 2017-05-06 ENCOUNTER — Encounter: Payer: Self-pay | Admitting: Cardiovascular Disease

## 2017-05-06 VITALS — BP 138/88 | HR 67 | Ht 72.0 in | Wt 183.0 lb

## 2017-05-06 DIAGNOSIS — E782 Mixed hyperlipidemia: Secondary | ICD-10-CM

## 2017-05-06 DIAGNOSIS — I2510 Atherosclerotic heart disease of native coronary artery without angina pectoris: Secondary | ICD-10-CM | POA: Diagnosis not present

## 2017-05-06 DIAGNOSIS — I10 Essential (primary) hypertension: Secondary | ICD-10-CM | POA: Diagnosis not present

## 2017-05-06 LAB — HEPATIC FUNCTION PANEL
ALT: 14 IU/L (ref 0–44)
AST: 17 IU/L (ref 0–40)
Albumin: 4.3 g/dL (ref 3.6–4.8)
Alkaline Phosphatase: 85 IU/L (ref 39–117)
Bilirubin Total: 0.6 mg/dL (ref 0.0–1.2)
Bilirubin, Direct: 0.18 mg/dL (ref 0.00–0.40)
TOTAL PROTEIN: 6.9 g/dL (ref 6.0–8.5)

## 2017-05-06 LAB — LIPID PANEL
Chol/HDL Ratio: 2.6 ratio (ref 0.0–5.0)
Cholesterol, Total: 148 mg/dL (ref 100–199)
HDL: 57 mg/dL (ref >39)
LDL Calculated: 80 mg/dL (ref 0–99)
TRIGLYCERIDES: 56 mg/dL (ref 0–149)
VLDL Cholesterol Cal: 11 mg/dL (ref 5–40)

## 2017-05-06 LAB — BASIC METABOLIC PANEL
BUN / CREAT RATIO: 22 (ref 10–24)
BUN: 19 mg/dL (ref 8–27)
CO2: 26 mmol/L (ref 20–29)
CREATININE: 0.88 mg/dL (ref 0.76–1.27)
Calcium: 9.7 mg/dL (ref 8.6–10.2)
Chloride: 101 mmol/L (ref 96–106)
GFR calc Af Amer: 106 mL/min/{1.73_m2} (ref >59)
GFR calc non Af Amer: 91 mL/min/{1.73_m2} (ref >59)
GLUCOSE: 71 mg/dL (ref 65–99)
Potassium: 4.6 mmol/L (ref 3.5–5.2)
Sodium: 138 mmol/L (ref 134–144)

## 2017-05-06 MED ORDER — CLOPIDOGREL BISULFATE 75 MG PO TABS
75.0000 mg | ORAL_TABLET | Freq: Every day | ORAL | 3 refills | Status: DC
Start: 2017-05-06 — End: 2018-04-06

## 2017-05-06 NOTE — Patient Instructions (Signed)

## 2017-05-06 NOTE — Progress Notes (Signed)
Cardiology Office Note:    Date:  05/06/2017   ID:  TEAGEN MCLEARY, DOB 1956-08-15, MRN [id number]  PCP:  Yvone Neu, MD  Cardiologist:  Mertie Moores, MD    Referring MD: Yvone Neu   Problem list 1. Coronary artery disease-status post coronary artery bypass graft - March 2018.  2. Hypertension 3. Hyperlipidemia  Chief Complaint  Patient presents with  . Follow-up    CAD    History of Present Illness:    Ricky Hughes is a 61 y.o. male with a hx of CAD I met him in the hospital in March when he presented with CP Cath showed tight stenosis of LAD and RCA.  He had CABG on March 9.  He is eager to return to work  No angina .  Has the usual chest soreness.   Lipids from primary MD Chol - 128 HDL - 57 Trig - 51 LDL - 58  Sept. 11, 2018  [name] is seen today for follow up of his CAD/ CABG. Also has hx of HTN and hyperlipidemia    Past Medical History:  Diagnosis Date  . Carotid stenosis    pre CABG [place] 9/37: 9-02% LICA   . Coronary artery disease    s/p PCI to LAD 2010; Cath 8/13: 1v CAD w/ patent stent in mLAD & mod non flow limiting disease beyond the stented segment, mild non-obstructive dz in LCx and RCA, nl LV systolic fxn, no evidence of aortic dissection // LHC 3/18: oLAD stent ok, dLAD 90, OM1 30, pRCA 100 with L-R collats, EF 55-66 >> CABG  . GERD (gastroesophageal reflux disease)   . HLD (hyperlipidemia)   . Hypertension   . Ulcer     Past Surgical History:  Procedure Laterality Date  . CARDIAC CATHETERIZATION    . CERVICAL SPINE SURGERY    . CHOLECYSTECTOMY    . CORONARY ANGIOPLASTY WITH STENT PLACEMENT    . CORONARY ARTERY BYPASS GRAFT N/A 11/01/2016   Procedure: CORONARY ARTERY BYPASS GRAFTING (CABG) TIMES TWO USING BILATERAL INTERNAL MAMMARY ARTERIES;  Surgeon: Gaye Pollack, MD;  Location: [place];  Service: Open Heart Surgery;  Laterality: N/A;  . LEFT HEART CATH AND CORONARY ANGIOGRAPHY N/A 10/28/2016   Procedure: Left  Heart Cath and Coronary Angiography;  Surgeon: Leonie Man, MD;  Location: [place] [facility];  Service: Cardiovascular;  Laterality: N/A;  . LEFT HEART CATHETERIZATION WITH CORONARY ANGIOGRAM N/A 04/20/2012   Procedure: LEFT HEART CATHETERIZATION WITH CORONARY ANGIOGRAM;  Surgeon: Burnell Blanks, MD;  Location[facility];  Service: Cardiovascular;  Laterality: N/A;  . TEE WITHOUT CARDIOVERSION N/A 11/01/2016   Procedure: TRANSESOPHAGEAL ECHOCARDIOGRAM (TEE);  Surgeon: Gaye Pollack, MD;  Location: [place];  Service: Open Heart Surgery;  Laterality: N/A;    Current Medications: Current Meds  Medication Sig  . aspirin 81 MG tablet Take 1 tablet (81 mg total) by mouth daily.  . [DISCONTINUED] clopidogrel (PLAVIX) 75 MG tablet TAKE 1 TABLET BY MOUTH DAILY     Allergies:   No known allergies   Social History   Social History  . Marital status: Married    Spouse name: N/A  . Number of children: N/A  . Years of education: N/A   Occupational History  . Government social research officer    Social History Main Topics  . Smoking status: Never Smoker  . Smokeless tobacco: Never Used  . Alcohol use Yes     Comment: rare  . Drug  use: No  . Sexual activity: Yes   Other Topics Concern  . None   Social History Narrative  . None     Family History: The patient's family history includes CAD (age of onset: 24) in his brother; Heart attack (age of onset: 50) in his mother; Heart attack (age of onset: 54) in his father. ROS:   Please see the history of present illness.     All other systems reviewed and are negative.  EKGs/Labs/Other Studies Reviewed:    The following studies were reviewed today:   EKG:    Recent Labs: 10/25/2016: ALT 20 11/02/2016: Magnesium 2.1 11/04/2016: BUN 12; Creatinine, Ser 0.87; Hemoglobin 10.3; Platelets 101; Potassium 4.0; Sodium 136   Recent Lipid Panel    Component Value Date/Time   CHOL 143 10/26/2016 0232   TRIG 41 10/26/2016 0232   HDL 54 10/26/2016  0232   CHOLHDL 2.6 10/26/2016 0232   VLDL 8 10/26/2016 0232   LDLCALC 81 10/26/2016 0232    Physical Exam:    VS:  BP 138/88   Pulse 67   Ht 6' (1.829 m)   Wt 183 lb (83 kg)   BMI 24.82 kg/m     Wt Readings from Last 3 Encounters:  05/06/17 183 lb (83 kg)  01/28/17 182 lb 8 oz (82.8 kg)  12/11/16 180 lb (81.6 kg)     GEN:  Well nourished, well developed in no acute distress HEENT: Normal NECK: No JVD; No carotid bruits LYMPHATICS: No lymphadenopathy CARDIAC: RRR, no murmurs, rubs, gallops RESPIRATORY:  Clear to auscultation without rales, wheezing or rhonchi  ABDOMEN: Soft, non-tender, non-distended MUSCULOSKELETAL:  No edema; No deformity  SKIN: Warm and dry NEUROLOGIC:  Alert and oriented x 3 PSYCHIATRIC:  Normal affect   ASSESSMENT:    1. Mixed hyperlipidemia   2. CAD, multiple vessel   3. Essential hypertension    PLAN:    In order of problems listed above:  1. CAD :   He status post coronary artery bypass grafting.  Doing great.    Check lipids today  Continue current meds .   2. Hyperlipidemia :  Continue current dose of atorvastatin. Check labs in 6 months .   Medication Adjustments/Labs and Tests Ordered: Current medicines are reviewed at length with the patient today.  Concerns regarding medicines are outlined above. Labs and tests ordered and medication changes are outlined in the patient instructions below:  Patient Instructions  Medication Instructions:  Your physician recommends that you continue on your current medications as directed. Please refer to the Current Medication list given to you today.   Labwork: TODAY - cholesterol, liver panel, basic metabolic panel   Testing/Procedures: None Ordered   Follow-Up: Your physician wants you to follow-up in: 6 months with Dr. Acie Fredrickson.  You will receive a reminder letter in the mail two months in advance. If you don't receive a letter, please call our office to schedule the follow-up  appointment.   If you need a refill on your cardiac medications before your next appointment, please call your pharmacy.   Thank you for choosing [facility]! Christen Bame, RN [phone]       Signed, Mertie Moores, MD  05/06/2017 8:02 AM    [place]

## 2017-09-21 ENCOUNTER — Other Ambulatory Visit: Payer: Self-pay | Admitting: Physician Assistant

## 2017-10-21 IMAGING — DX DG CHEST 2V
2 series · 2 of 2 positions shown · non-contrast
Comparison: PA and lateral chest 11/04/2016.

CLINICAL DATA: Status post CABG 11/01/2016.  Chest soreness.

EXAM:
CHEST  2 VIEW

[dg chest 2 view (1 of 2)]
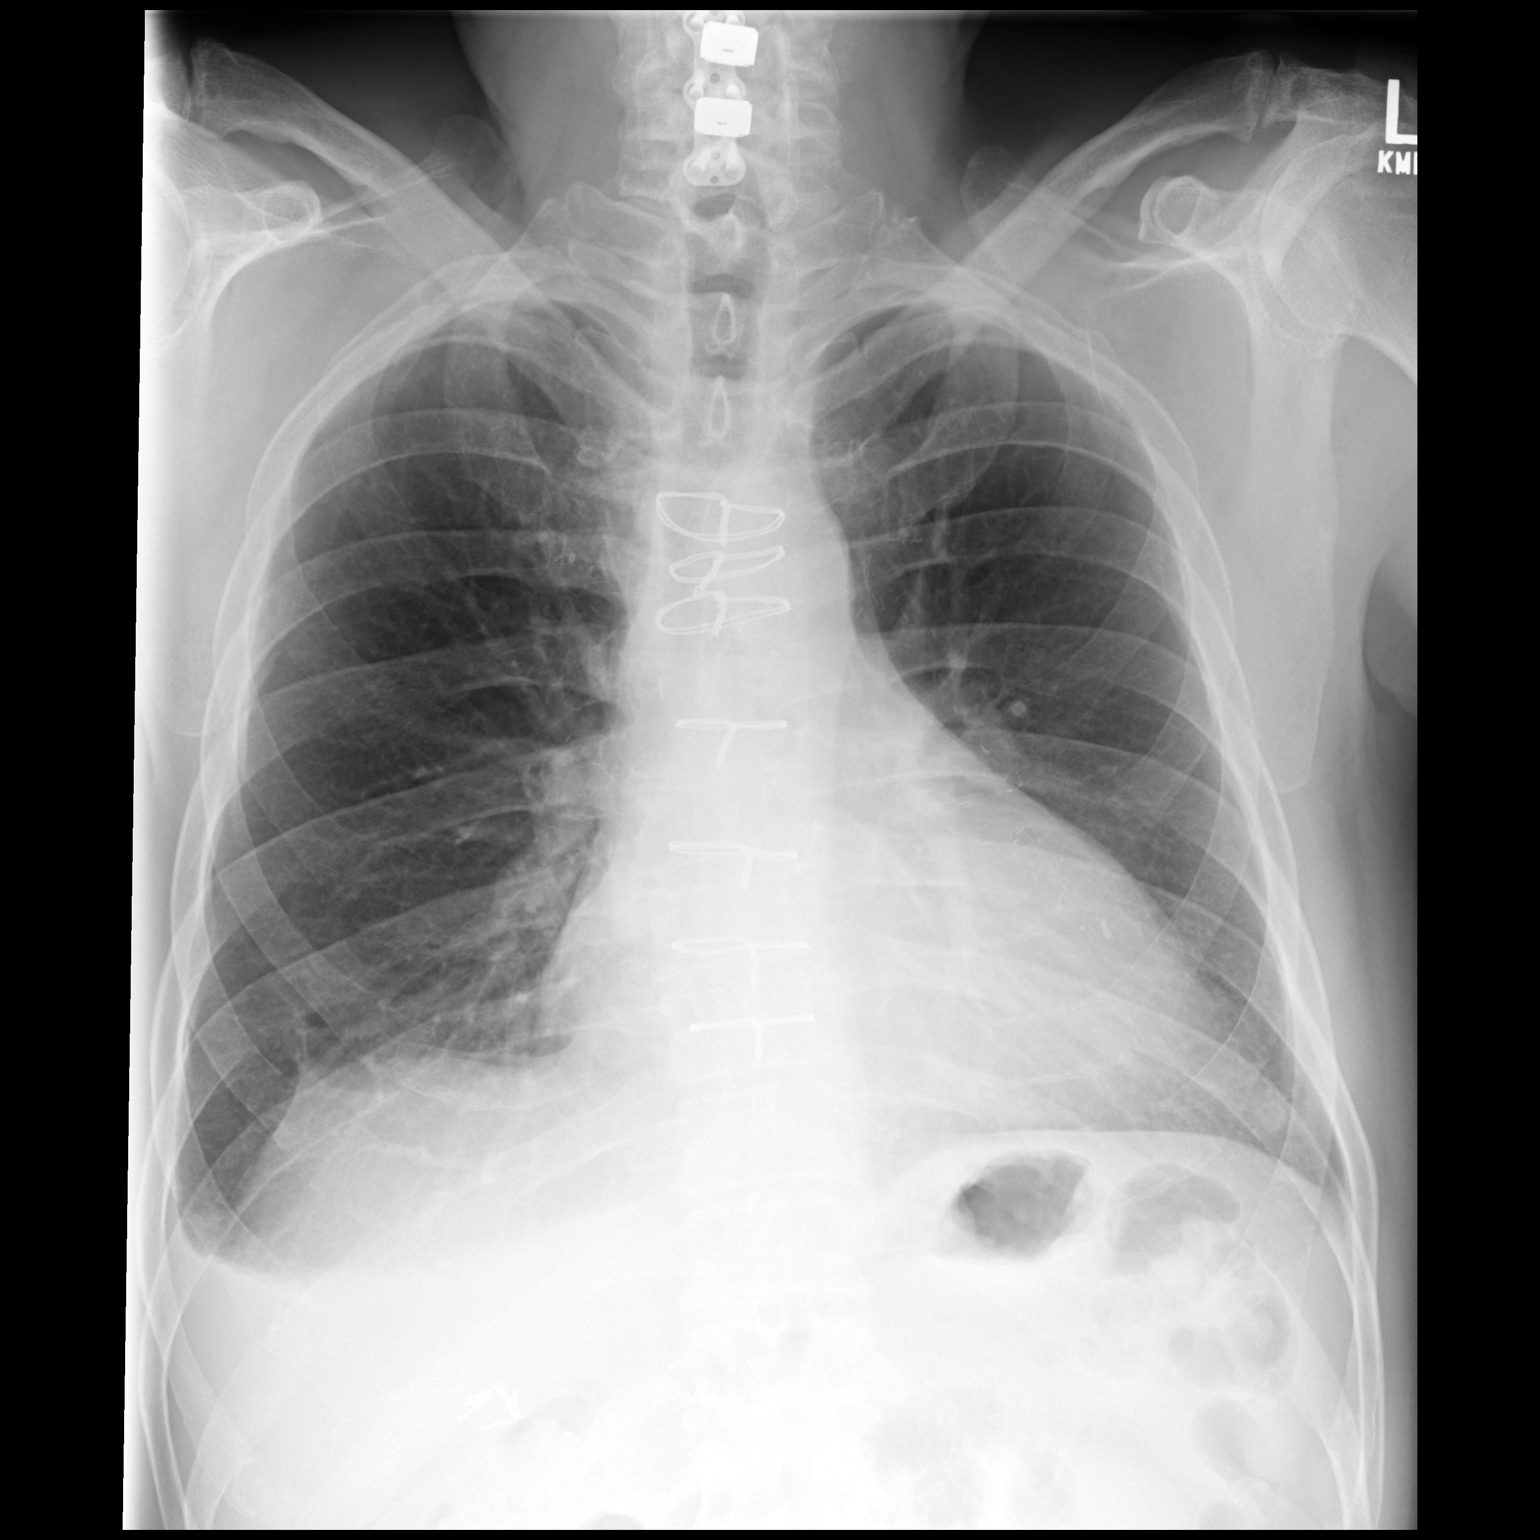

[dg chest 2 view (2 of 2)]
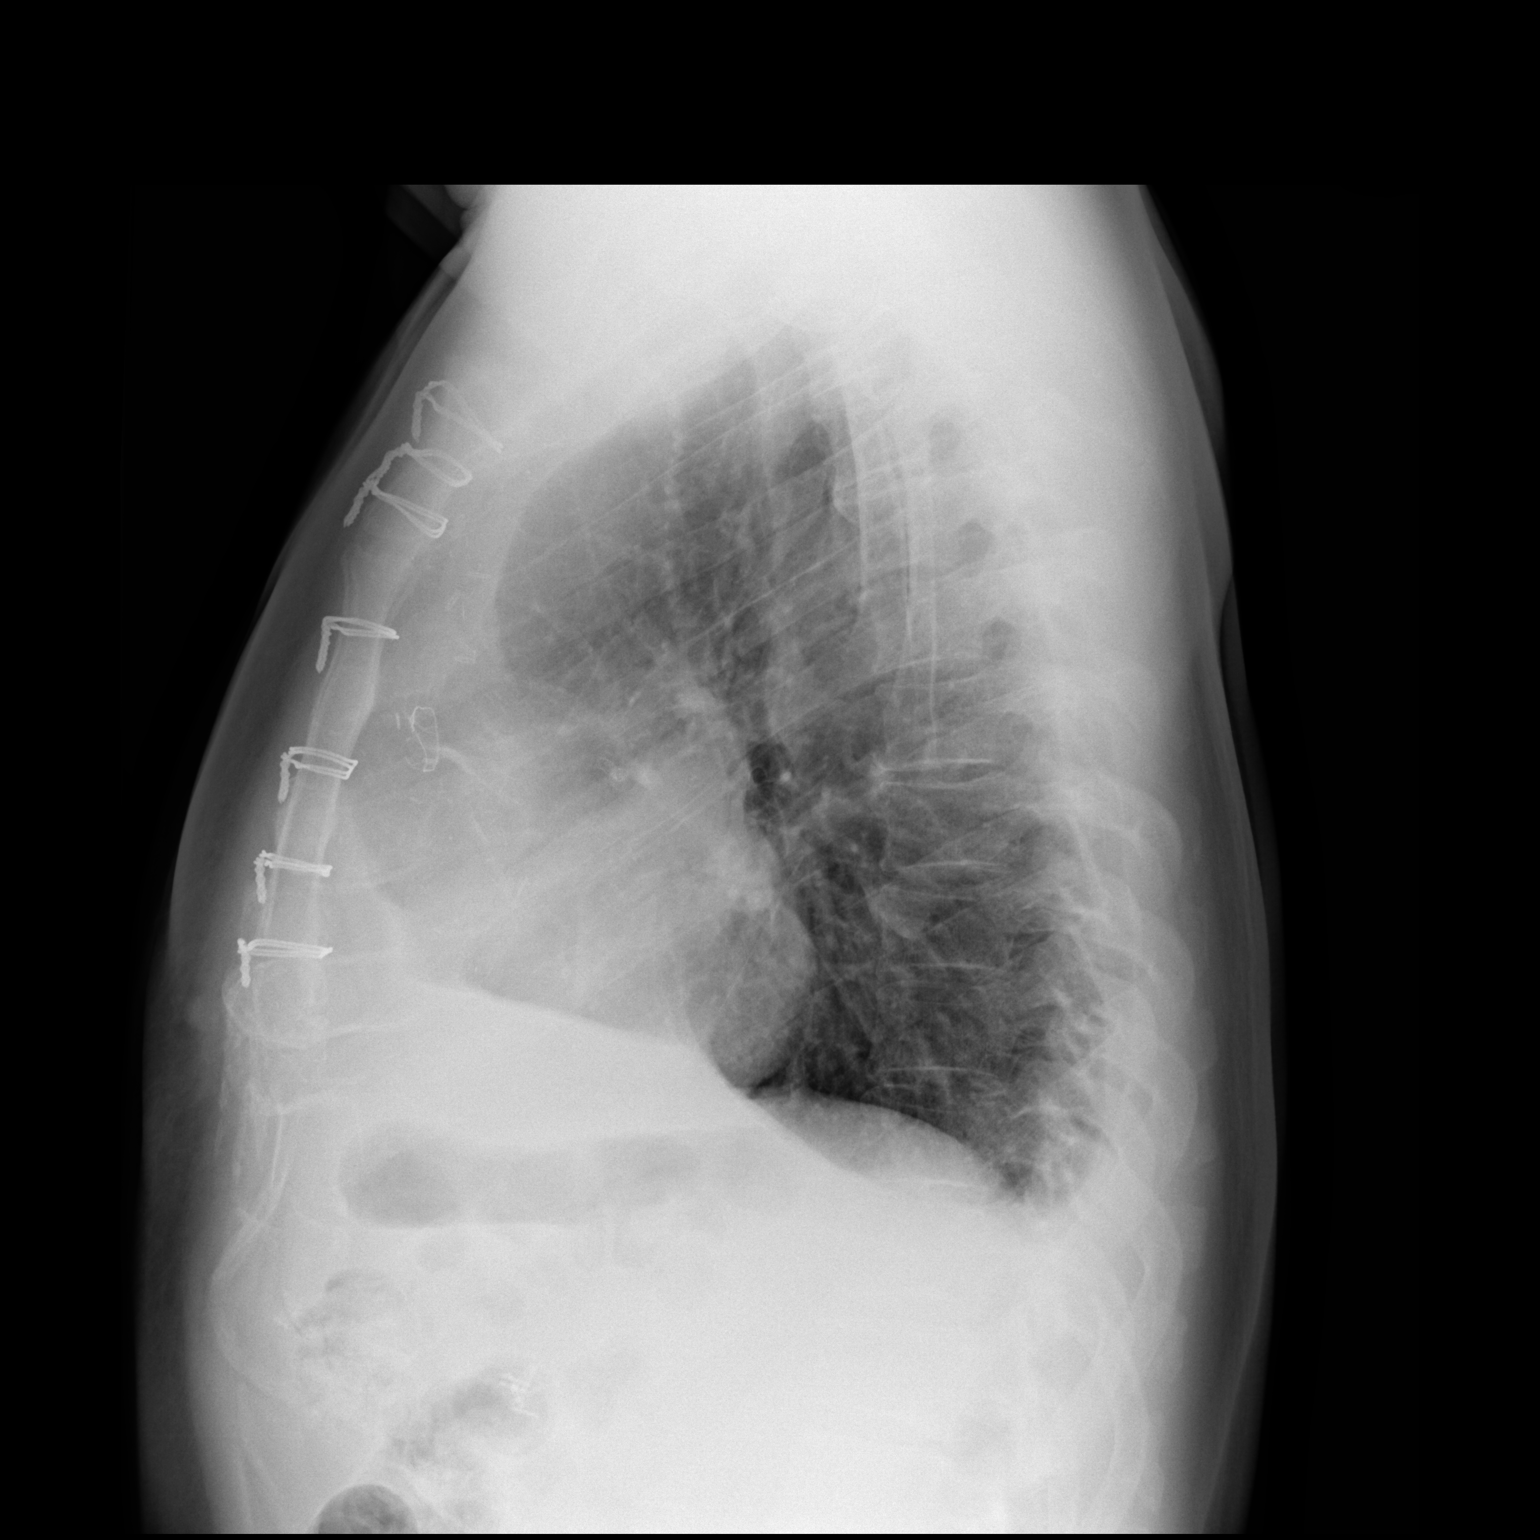

[2 of 2 positions shown; findings below may reference images not displayed]

FINDINGS: Right pleural effusion has markedly decreased since the prior
examination. There is no left effusion. Mild right basilar
atelectasis is noted. There is cardiomegaly without edema. Seven
intact median sternotomy wires are unchanged.
IMPRESSION: Marked decrease in right pleural effusion since the prior
examination. No new abnormality.

## 2017-11-19 ENCOUNTER — Other Ambulatory Visit: Payer: Self-pay | Admitting: Physician Assistant

## 2017-11-20 ENCOUNTER — Other Ambulatory Visit: Payer: Self-pay | Admitting: Physician Assistant

## 2018-03-06 ENCOUNTER — Telehealth: Payer: Self-pay | Admitting: Cardiovascular Disease

## 2018-03-06 NOTE — Telephone Encounter (Signed)
Spoke with patient's wife who called to ask if patient needs sooner appointment with Dr. Elease HashimotoNahser for management of DVT. She states [place] and was told he has blood clot from his lab work. She states he has ultrasound scheduled today and was given 2 Rx, one of which is for a blood thinner. I advised her that it is very important that he start the medications ASAP. I advised that patient can continue to follow plan of PCP for consistency and convenience, since [place].  I advised that if patient has any questions or concerns, to call back for sooner appointment. Wife verbalized understanding and agreement and thanked me for the call.

## 2018-03-06 NOTE — Telephone Encounter (Signed)
New Message:       Pt's wife states pt has a blood clot in his leg and she wanted to know should he just continue to see his PCP for this or have a sooner appt with Nahser.

## 2018-04-01 ENCOUNTER — Telehealth: Payer: Self-pay | Admitting: Cardiovascular Disease

## 2018-04-01 NOTE — Telephone Encounter (Signed)
New Message    Pt c/o medication issue:  1. Name of Medication: unknown  2. How are you currently taking this medication (dosage and times per day)? unknown  3. Are you having a reaction (difficulty breathing--STAT)? Bruising on his chest  4. What is your medication issue? Patients wife is calling on his behalf. She states that the PCP gave him two new medication but she does not know the name of it. But he has some bruising. Please call.

## 2018-04-01 NOTE — Telephone Encounter (Signed)
I spoke with pt's wife. She reports pt noted bruising on chest last Friday. Saw primary care on Monday and this was evaluated. It was determined bruising may be from contact against dishwasher when pt was repairing this recently. Pt is taking blood thinner but wife does not know name of this medication. She is asking if pt needs sooner appointment with Dr. Elease HashimotoNahser. Pt is not having any chest pain or shortness of breath. I told wife to follow instructions as outlined by primary care and to follow up with Dr Elease HashimotoNahser as planned.  I told pt's wife I would make Marcelino DusterMichelle aware and she would call her if earlier appointment needed.

## 2018-04-06 NOTE — Telephone Encounter (Signed)
Spoke with Dr. Elease HashimotoNahser by telephone who advised that patient may d/c Plavix and continue Aspirin 81 mg to see if bruising improves. I called patient's wife and reviewed his advice with her. She states patient also takes Xarelto for a blood clot, which I was previously not aware of. She states Dr. Norval GableHungarland started the patient on this medication. I advised her that dose changes for that medication will have to be discussed with Dr. Norval GableHungarland. She requests an appointment for Dr. Elease HashimotoNahser sooner than October. I advised that I will work on getting a sooner appointment and advised patient that I will call back with details. She verbalized understanding and agreement with plan and thanked me for the call.

## 2018-04-06 NOTE — Telephone Encounter (Signed)
Spoke with patient's wife who states patient continues to have severe bruising and some "knots."  She states his PCP, Dr. Norval GableHungarland, advised him to take the Plavix and ASA on alternating days. Wife states she does not notice an improvement since patient started doing this. I advised that I will forward message to Dr. Elease HashimotoNahser regarding medication therapy and if patient can d/c either the Plavix or the aspirin. I advised that there is a blood test we can do to check blood level on plavix. I advised wife I would call back with Dr. Harvie BridgeNahser's advice and if needed, will schedule sooner appointment for patient. Wife verbalized understanding and agreement and thanked me for the call.

## 2018-04-09 NOTE — Telephone Encounter (Signed)
Patient scheduled to see Dr. Elease HashimotoNahser on 8/20 at 2:00 pm

## 2018-04-14 ENCOUNTER — Ambulatory Visit: Payer: Managed Care, Other (non HMO) | Admitting: Cardiovascular Disease

## 2018-05-27 ENCOUNTER — Ambulatory Visit: Payer: Managed Care, Other (non HMO) | Admitting: Cardiovascular Disease

## 2018-06-23 ENCOUNTER — Ambulatory Visit: Payer: Managed Care, Other (non HMO) | Admitting: Cardiovascular Disease

## 2018-06-23 DIAGNOSIS — R0989 Other specified symptoms and signs involving the circulatory and respiratory systems: Secondary | ICD-10-CM

## 2018-06-24 ENCOUNTER — Encounter: Payer: Self-pay | Admitting: Cardiovascular Disease

## 2019-01-27 ENCOUNTER — Telehealth: Payer: Self-pay | Admitting: Cardiovascular Disease

## 2019-01-27 NOTE — Telephone Encounter (Signed)
   Spoke with patient's wife who states patient had chest pain relieved by NTG on 6/1. States BP was 150/85 mmHg following relief of pain. States patient rested yesterday and is back at work today. Continues to have fatigue today. Scheduled patient for clinic appointment on 6/4 with Nada Boozer, NP due to Dr. Elease Hashimoto is a rounder this week and has no clinic time. Wife verbalized understanding and agreement and thanked me for the call. She is aware that patient will have to come inside alone and will be screened again upon his arrival.   COVID-19 Pre-Screening Questions:  . In the past 7 to 10 days have you had a cough,  shortness of breath, headache, congestion, fever (100 or greater) body aches, chills, sore throat, or sudden loss of taste or sense of smell? No . Have you been around anyone with known Covid 19. No . Have you been around anyone who is awaiting Covid 19 test results in the past 7 to 10 days? No . Have you been around anyone who has been exposed to Covid 19, or has mentioned symptoms of Covid 19 within the past 7 to 10 days? No  If you have any concerns/questions about symptoms patients report during screening (either on the phone or at threshold). Contact the provider seeing the patient or DOD for further guidance.  If neither are available contact a member of the leadership team.

## 2019-01-27 NOTE — Telephone Encounter (Signed)
Spoke with wife, Zella Ball. States her husband c/o chest pressure 01/25/2019 evening. Pt took 2-3 nitro with relief, she tried to get him to go to ED by ambulance but he declined.  Yesterday pt still didn't feel well, no cp but had weakness and fatigue, slept a lot.  Today pt went to work but still feeling weak. Per wife, discomfort was like prior cp to stent/cabg 10/2016.  Wife would like an appointment sooner than later, pt has an android phone but is not tech savvy per wife and would probably do better w a call. Wife was told that I will forward msg to Dr/RN to see if asap app could happen, wife/ robin was satisfied with call.

## 2019-01-27 NOTE — Progress Notes (Signed)
Cardiology Office Note   Date:  01/28/2019   ID:  Ricky Hughes, DOB [birth year], MRN [id number]  PCP:  Maximiano Coss, MD  Cardiologist:  Dr. Elease Hashimoto    Chief Complaint  Patient presents with  . Chest Pain      History of Present Illness: Ricky Hughes is a 63 y.o. male who presents for chest pain.   He has a hx of CAD  Cath showed tight stenosis of LAD and RCA.  He had CABG on November 01, 2016.  Left internal mammary graft to the LAD Free right internal mammary graft to the PDA  Today he tells me on Tuesday he had terrible pressure pain mid sternal.  No radiation and no nausea or diaphoresis, did have trouble catching his breath.  He took a total of 4 SL NTG and NTG would help it get some better but not go away, after 3 hours pain resolved.  His back bothered him after that but no further pain and he has returned to work.  He has some chest wall tenderness to palpation.  He also notes that 7 months ago he woke with huge bruise on Lt and chest.  No injury.  Otherwise he has had no chest pain or SOB.  No palpitations, no racing heart rate.      Past Medical History:  Diagnosis Date  . Carotid stenosis    pre CABG [place] 0/86: 1-39% LICA   . Coronary artery disease    s/p PCI to LAD 2010; Cath 8/13: 1v CAD w/ patent stent in mLAD & mod non flow limiting disease beyond the stented segment, mild non-obstructive dz in LCx and RCA, nl LV systolic fxn, no evidence of aortic dissection // LHC 3/18: oLAD stent ok, dLAD 90, OM1 30, pRCA 100 with L-R collats, EF 55-66 >> CABG  . GERD (gastroesophageal reflux disease)   . HLD (hyperlipidemia)   . Hypertension   . Ulcer     Past Surgical History:  Procedure Laterality Date  . CARDIAC CATHETERIZATION    . CERVICAL SPINE SURGERY    . CHOLECYSTECTOMY    . CORONARY ANGIOPLASTY WITH STENT PLACEMENT    . CORONARY ARTERY BYPASS GRAFT N/A 11/01/2016   Procedure: CORONARY ARTERY BYPASS GRAFTING (CABG) TIMES TWO USING BILATERAL INTERNAL  MAMMARY ARTERIES;  Surgeon: Alleen Borne, MD;  Location: [facility] OR;  Service: Open Heart Surgery;  Laterality: N/A;  . LEFT HEART CATH AND CORONARY ANGIOGRAPHY N/A 10/28/2016   Procedure: Left Heart Cath and Coronary Angiography;  Surgeon: Marykay Lex, MD;  Location: [facility] INVASIVE CV LAB;  Service: Cardiovascular;  Laterality: N/A;  . LEFT HEART CATHETERIZATION WITH CORONARY ANGIOGRAM N/A 04/20/2012   Procedure: LEFT HEART CATHETERIZATION WITH CORONARY ANGIOGRAM;  Surgeon: Kathleene Hazel, MD;  Location: [facility] CATH LAB;  Service: Cardiovascular;  Laterality: N/A;  . TEE WITHOUT CARDIOVERSION N/A 11/01/2016   Procedure: TRANSESOPHAGEAL ECHOCARDIOGRAM (TEE);  Surgeon: Alleen Borne, MD;  Location: [facility] OR;  Service: Open Heart Surgery;  Laterality: N/A;     Current Outpatient Medications  Medication Sig Dispense Refill  . aspirin 81 MG tablet Take 1 tablet (81 mg total) by mouth daily.    . metoprolol tartrate (LOPRESSOR) 25 MG tablet Take 0.5 tablets (12.5 mg total) by mouth 2 (two) times daily. 90 tablet 1   No current facility-administered medications for this visit.     Allergies:   No known allergies    Social History:  The patient  reports that he has never smoked. He has never used smokeless tobacco. He reports current alcohol use. He reports that he does not use drugs.   Family History:  The patient's family history includes CAD (age of onset: 9) in his brother; Heart attack (age of onset: 47) in his mother; Heart attack (age of onset: 49) in his father.    ROS:  General:no colds or fevers, no weight changes Skin:no rashes or ulcers HEENT:no blurred vision, no congestion CV:see HPI PUL:see HPI GI:no diarrhea constipation or melena, no indigestion GU:no hematuria, no dysuria MS:no joint pain, no claudication Neuro:no syncope, no lightheadedness Endo:no diabetes, no thyroid disease  Wt Readings from Last 3 Encounters:  01/28/19 187 lb 1.9 oz (84.9 kg)  05/06/17 183 lb (83  kg)  01/28/17 182 lb 8 oz (82.8 kg)     PHYSICAL EXAM: VS:  BP 114/70   Pulse (!) 59   Ht 6' (1.829 m)   Wt 187 lb 1.9 oz (84.9 kg)   SpO2 98%   BMI 25.38 kg/m  , BMI Body mass index is 25.38 kg/m. General:Pleasant affect, NAD Skin:Warm and dry, brisk capillary refill HEENT:normocephalic, sclera clear, mucus membranes moist Neck:supple, no JVD, no bruits  Heart:S1S2 RRR without murmur, gallup, rub or click, + chest tenderness to palpation Lungs:clear without rales, rhonchi, or wheezes CZY:SAYT, non tender, + BS, do not palpate liver spleen or masses Ext:no lower ext edema, 2+ pedal pulses, 2+ radial pulses Neuro:alert and oriented X 3, MAE, follows commands, + facial symmetry    EKG:  EKG is ordered today. The ekg ordered today demonstrates SB at 56 with lateral infarct age undetermined.  No acute changes.  Dr. Ladona Ridgel reviewed with me.     Recent Labs: No results found for requested labs within last 8760 hours.    Lipid Panel    Component Value Date/Time   CHOL 148 05/06/2017 0811   TRIG 56 05/06/2017 0811   HDL 57 05/06/2017 0811   CHOLHDL 2.6 05/06/2017 0811   CHOLHDL 2.6 10/26/2016 0232   VLDL 8 10/26/2016 0232   LDLCALC 80 05/06/2017 0811       Other studies Reviewed: Additional studies/ records that were reviewed today include: . Cath prior to CABG   Prox RCA to Dist RCA lesion, 100 %stenosed. - The RPDA with retrograde flow into the posterior lateral branch fills via collaterals from the LAD. The RV marginal branch fills via collaterals from the distal LAD.  Mid LAD stent, 0 %stenosed.  Dist LAD lesion, 90 %stenosed - just beyond the stent  There is mild (2+) mitral regurgitation.  The left ventricular systolic function is normal. The left ventricular ejection fraction is 55-65% by visual estimate.  LV end diastolic pressure is moderately elevated.   Patient has severe 2 vessel disease involving the LAD that is the main conduit to the occluded RCA.  Given his risk factors and history of proximal compliance, he is probably best served with bypass surgery. We will stop the Plavix. CT surgery was consulted.  Otherwise continue treatment cardiac risk factors.  Plan:  Return to nursing unit with TR band removal.  DC Plavix  Restart IV heparin 8 hours post cath  CT surgical consult as been placed  ASSESSMENT AND PLAN:  1.  Chest pain, somewhat atypical, now chest wall tenderness. Possible muscle spasm.  Will check CBC and BMP will do CXR especially since he had large bruise months ago possible fx rib.  Discussed with Dr. Ladona Ridgel will  plan for lexiscan myoview but if pain returns he should go to ER.  Follow up in 2 weeks unless nuc is + then would need caht.   2. CAD with CABG with RIMA and LIMA.    3.  HLD on statin. Continue   4.  HTN controlled and recheck it was 130/60   Current medicines are reviewed with the patient today.  The patient Has no concerns regarding medicines.  The following changes have been made:  See above Labs/ tests ordered today include:see above  Disposition:   FU:  see above  Signed, Fayette Gasner, NP  01/28/2019 4:21 PM    [facility] [address], [place], [place]  [address]/ [address] [place], [place] Phone: [phone]; Fax: [phone]  [phone]

## 2019-01-27 NOTE — Telephone Encounter (Signed)
Last seen with Nahser 2018

## 2019-01-27 NOTE — Telephone Encounter (Signed)
New message:    Patient wife calling concerning that her husband  Is having some chest pains and having some SOB and states that his right hip is also hurting. Patient would like to get a sooner.

## 2019-01-28 ENCOUNTER — Other Ambulatory Visit: Payer: Self-pay

## 2019-01-28 ENCOUNTER — Ambulatory Visit
Admission: RE | Admit: 2019-01-28 | Discharge: 2019-01-28 | Disposition: A | Discharge status: Home / Self Care | Payer: Managed Care, Other (non HMO) | Source: Ambulatory Visit | Attending: Cardiology | Admitting: Cardiology

## 2019-01-28 ENCOUNTER — Encounter: Payer: Self-pay | Admitting: Cardiology

## 2019-01-28 ENCOUNTER — Encounter: Payer: Self-pay | Admitting: *Deleted

## 2019-01-28 ENCOUNTER — Ambulatory Visit (INDEPENDENT_AMBULATORY_CARE_PROVIDER_SITE_OTHER): Payer: Managed Care, Other (non HMO) | Admitting: Cardiology

## 2019-01-28 VITALS — BP 114/70 | HR 59 | Ht 72.0 in | Wt 187.1 lb

## 2019-01-28 DIAGNOSIS — I10 Essential (primary) hypertension: Secondary | ICD-10-CM

## 2019-01-28 DIAGNOSIS — E782 Mixed hyperlipidemia: Secondary | ICD-10-CM

## 2019-01-28 DIAGNOSIS — I2510 Atherosclerotic heart disease of native coronary artery without angina pectoris: Secondary | ICD-10-CM

## 2019-01-28 DIAGNOSIS — R079 Chest pain, unspecified: Secondary | ICD-10-CM

## 2019-01-28 DIAGNOSIS — R0789 Other chest pain: Secondary | ICD-10-CM

## 2019-01-28 NOTE — Patient Instructions (Addendum)
Medication Instructions:  Your physician recommends that you continue on your current medications as directed. Please refer to the Current Medication list given to you today.  If you need a refill on your cardiac medications before your next appointment, please call your pharmacy.   Lab work: TODAY:  BMET & CBC  If you have labs (blood work) drawn today and your tests are completely normal, you will receive your results only by: Marland Kitchen MyChart Message (if you have MyChart) OR . A paper copy in the mail If you have any lab test that is abnormal or we need to change your treatment, we will call you to review the results.  Testing/Procedures: Your physician has requested that you have a lexiscan myoview. For further information please visit [email or web address] Please follow instruction sheet, as given.  A chest x-ray takes a picture of the organs and structures inside the chest, including the heart, lungs, and blood vessels. This test can show several things, including, whether the heart is enlarges; whether fluid is building up in the lungs; and whether pacemaker / defibrillator leads are still in place. GO TO DAY TO HAVE THIS DONE    Follow-Up: At [facility], you and your health needs are our priority.  As part of our continuing mission to provide you with exceptional heart care, we have created designated Provider Care Teams.  These Care Teams include your primary Cardiologist (physician) and Advanced Practice Providers (APPs -  Physician Assistants and Nurse Practitioners) who all work together to provide you with the care you need, when you need it. You will need a follow up appointment in:  2 weeks.. YOU ARE SCHEDULED FOR A VIRTUAL VIDEO VISIT WITH LAURA INGOLD, 02/11/2019 AT 8:00.  PLEASE HAVE YOUR BLOOD PRESSURE AND PULSE RATE THAT MORNING.  SOMEONE WILL CALL YOU 15 MINS PRIOR TO 8:00 AND GET THAT INFORMATION FROM YOU.  SEE INSTRUCTIONS ON A VIRTUAL VISIT BELOW:  YOUR CARDIOLOGY TEAM HAS  ARRANGED FOR AN E-VISIT FOR YOUR APPOINTMENT - PLEASE REVIEW IMPORTANT INFORMATION BELOW SEVERAL DAYS PRIOR TO YOUR APPOINTMENT  Due to the recent COVID-19 pandemic, we are transitioning in-person office visits to tele-medicine visits in an effort to decrease unnecessary exposure to our patients, their families, and staff. These visits are billed to your insurance just like a normal visit is. We also encourage you to sign up for MyChart if you have not already done so. You will need a smartphone if possible. For patients that do not have this, we can still complete the visit using a regular telephone but do prefer a smartphone to enable video when possible. You may have a family member that lives with you that can help. If possible, we also ask that you have a blood pressure cuff and scale at home to measure your blood pressure, heart rate and weight prior to your scheduled appointment. Patients with clinical needs that need an in-person evaluation and testing will still be able to come to the office if absolutely necessary. If you have any questions, feel free to call our office.     YOUR PROVIDER WILL BE USING THE FOLLOWING PLATFORM TO COMPLETE YOUR VISIT: Doximity  . IF USING DOXIMITY or [email or web address] - The staff will give you instructions on receiving your link to join the meeting the day of your visit.    2-3 DAYS BEFORE YOUR APPOINTMENT  You will receive a telephone call from one of our HeartCare team members - your caller ID may say "Unknown caller." If  this is a video visit, we will walk you through how to get the video launched on your phone. We will remind you check your blood pressure, heart rate and weight prior to your scheduled appointment. If you have an Apple Watch or Kardia, please upload any pertinent ECG strips the day before or morning of your appointment to MyChart. Our staff will also make sure you have reviewed the consent and agree to move forward with your scheduled tele-health  visit.     THE DAY OF YOUR APPOINTMENT  Approximately 15 minutes prior to your scheduled appointment, you will receive a telephone call from one of HeartCare team - your caller ID may say "Unknown caller."  Our staff will confirm medications, vital signs for the day and any symptoms you may be experiencing. Please have this information available prior to the time of visit start. It may also be helpful for you to have a pad of paper and pen handy for any instructions given during your visit. They will also walk you through joining the smartphone meeting if this is a video visit.    CONSENT FOR TELE-HEALTH VISIT - PLEASE REVIEW  I hereby voluntarily request, consent and authorize [facility] and its employed or contracted physicians, physician assistants, nurse practitioners or other licensed health care professionals (the Practitioner), to provide me with telemedicine health care services (the "Services") as deemed necessary by the treating Practitioner. I acknowledge and consent to receive the Services by the Practitioner via telemedicine. I understand that the telemedicine visit will involve communicating with the Practitioner through live audiovisual communication technology and the disclosure of certain medical information by electronic transmission. I acknowledge that I have been given the opportunity to request an in-person assessment or other available alternative prior to the telemedicine visit and am voluntarily participating in the telemedicine visit.  I understand that I have the right to withhold or withdraw my consent to the use of telemedicine in the course of my care at any time, without affecting my right to future care or treatment, and that the Practitioner or I may terminate the telemedicine visit at any time. I understand that I have the right to inspect all information obtained and/or recorded in the course of the telemedicine visit and may receive copies of available information  for a reasonable fee.  I understand that some of the potential risks of receiving the Services via telemedicine include:  Marland Kitchen Delay or interruption in medical evaluation due to technological equipment failure or disruption; . Information transmitted may not be sufficient (e.g. poor resolution of images) to allow for appropriate medical decision making by the Practitioner; and/or  . In rare instances, security protocols could fail, causing a breach of personal health information.  Furthermore, I acknowledge that it is my responsibility to provide information about my medical history, conditions and care that is complete and accurate to the best of my ability. I acknowledge that Practitioner's advice, recommendations, and/or decision may be based on factors not within their control, such as incomplete or inaccurate data provided by me or distortions of diagnostic images or specimens that may result from electronic transmissions. I understand that the practice of medicine is not an exact science and that Practitioner makes no warranties or guarantees regarding treatment outcomes. I acknowledge that I will receive a copy of this consent concurrently upon execution via email to the email address I last provided but may also request a printed copy by calling the office of [facility].  I understand that my insurance will be billed for this visit.   I have read or had this consent read to me. . I understand the contents of this consent, which adequately explains the benefits and risks of the Services being provided via telemedicine.  . I have been provided ample opportunity to ask questions regarding this consent and the Services and have had my questions answered to my satisfaction. . I give my informed consent for the services to be provided through the use of telemedicine in my medical care  By participating in this telemedicine visit I agree to the above.   Any Other Special Instructions Will Be  Listed Below (If Applicable).  Cardiac Nuclear Scan A cardiac nuclear scan is a test that measures blood flow to the heart when a person is resting and when he or she is exercising. The test looks for problems such as:  Not enough blood reaching a portion of the heart.  The heart muscle not working normally. You may need this test if:  You have heart disease.  You have had abnormal lab results.  You have had heart surgery or a balloon procedure to open up blocked arteries (angioplasty).  You have chest pain.  You have shortness of breath. In this test, a radioactive dye (tracer) is injected into your bloodstream. After the tracer has traveled to your heart, an imaging device is used to measure how much of the tracer is absorbed by or distributed to various areas of your heart. This procedure is usually done at a hospital and takes 2-4 hours. Tell a health care provider about:  Any allergies you have.  All medicines you are taking, including vitamins, herbs, eye drops, creams, and over-the-counter medicines.  Any problems you or family members have had with anesthetic medicines.  Any blood disorders you have.  Any surgeries you have had.  Any medical conditions you have.  Whether you are pregnant or may be pregnant. What are the risks? Generally, this is a safe procedure. However, problems may occur, including:  Serious chest pain and heart attack. This is only a risk if the stress portion of the test is done.  Rapid heartbeat.  Sensation of warmth in your chest. This usually passes quickly.  Allergic reaction to the tracer. What happens before the procedure?  Ask your health care provider about changing or stopping your regular medicines. This is especially important if you are taking diabetes medicines or blood thinners.  Follow instructions from your health care provider about eating or drinking restrictions.  Remove your jewelry on the day of the procedure. What  happens during the procedure?  An IV will be inserted into one of your veins.  Your health care provider will inject a small amount of radioactive tracer through the IV.  You will wait for 20-40 minutes while the tracer travels through your bloodstream.  Your heart activity will be monitored with an electrocardiogram (ECG).  You will lie down on an exam table.  Images of your heart will be taken for about 15-20 minutes.  You may also have a stress test. For this test, one of the following may be done: ? You will exercise on a treadmill or stationary bike. While you exercise, your heart's activity will be monitored with an ECG, and your blood pressure will be checked. ? You will be given medicines that will increase blood flow to parts of your heart. This is done if you are unable to exercise.  When blood  flow to your heart has peaked, a tracer will again be injected through the IV.  After 20-40 minutes, you will get back on the exam table and have more images taken of your heart.  Depending on the type of tracer used, scans may need to be repeated 3-4 hours later.  Your IV line will be removed when the procedure is over. The procedure may vary among health care providers and hospitals. What happens after the procedure?  Unless your health care provider tells you otherwise, you may return to your normal schedule, including diet, activities, and medicines.  Unless your health care provider tells you otherwise, you may increase your fluid intake. This will help to flush the contrast dye from your body. Drink enough fluid to keep your urine pale yellow.  Ask your health care provider, or the department that is doing the test: ? When will my results be ready? ? How will I get my results? Summary  A cardiac nuclear scan measures the blood flow to the heart when a person is resting and when he or she is exercising.  Tell your health care provider if you are pregnant.  Before the  procedure, ask your health care provider about changing or stopping your regular medicines. This is especially important if you are taking diabetes medicines or blood thinners.  After the procedure, unless your health care provider tells you otherwise, increase your fluid intake. This will help flush the contrast dye from your body.  After the procedure, unless your health care provider tells you otherwise, you may return to your normal schedule, including diet, activities, and medicines. This information is not intended to replace advice given to you by your health care provider. Make sure you discuss any questions you have with your health care provider. Document Released: 09/06/2004 Document Revised: 01/26/2018 Document Reviewed: 01/26/2018 Elsevier Interactive Patient Education  2019 [employer]

## 2019-01-29 LAB — BASIC METABOLIC PANEL
BUN/Creatinine Ratio: 17 (ref 10–24)
BUN: 21 mg/dL (ref 8–27)
CO2: 27 mmol/L (ref 20–29)
Calcium: 9.8 mg/dL (ref 8.6–10.2)
Chloride: 102 mmol/L (ref 96–106)
Creatinine, Ser: 1.26 mg/dL (ref 0.76–1.27)
GFR calc Af Amer: 69 mL/min/{1.73_m2} (ref >59)
GFR calc non Af Amer: 59 mL/min/{1.73_m2} — ABNORMAL LOW (ref >59)
Glucose: 91 mg/dL (ref 65–99)
Potassium: 4.2 mmol/L (ref 3.5–5.2)
Sodium: 142 mmol/L (ref 134–144)

## 2019-01-29 LAB — CBC
Hematocrit: 42.6 % (ref 37.5–51.0)
Hemoglobin: 14.5 g/dL (ref 13.0–17.7)
MCH: 30.1 pg (ref 26.6–33.0)
MCHC: 34 g/dL (ref 31.5–35.7)
MCV: 89 fL (ref 79–97)
Platelets: 184 10*3/uL (ref 150–450)
RBC: 4.81 x10E6/uL (ref 4.14–5.80)
RDW: 12.6 % (ref 11.6–15.4)
WBC: 7.5 10*3/uL (ref 3.4–10.8)

## 2019-02-01 ENCOUNTER — Telehealth (HOSPITAL_COMMUNITY): Payer: Self-pay | Admitting: *Deleted

## 2019-02-01 NOTE — Telephone Encounter (Signed)
Patient's wife, per dpr, given detailed instructions per Myocardial Perfusion Study Information Sheet for the test on 02/03/19. Patient notified to arrive 15 minutes early and that it is imperative to arrive on time for appointment to keep from having the test rescheduled.  If you need to cancel or reschedule your appointment, please call the office within 24 hours of your appointment. . Patient verbalized understanding. Kirstie Peri  ]

## 2019-02-03 ENCOUNTER — Other Ambulatory Visit: Payer: Self-pay

## 2019-02-03 ENCOUNTER — Ambulatory Visit (HOSPITAL_COMMUNITY): Payer: Managed Care, Other (non HMO) | Attending: Cardiovascular Disease

## 2019-02-03 DIAGNOSIS — R079 Chest pain, unspecified: Secondary | ICD-10-CM

## 2019-02-03 DIAGNOSIS — R0789 Other chest pain: Secondary | ICD-10-CM | POA: Diagnosis present

## 2019-02-03 LAB — MYOCARDIAL PERFUSION IMAGING
LV dias vol: 142 mL (ref 62–150)
LV sys vol: 74 mL
Peak HR: 73 {beats}/min
Rest HR: 58 {beats}/min
SDS: 9
SRS: 14
SSS: 24
TID: 1.04

## 2019-02-03 MED ORDER — TECHNETIUM TC 99M TETROFOSMIN IV KIT
30.5000 | PACK | Freq: Once | INTRAVENOUS | Status: AC | PRN
  Administered 2019-02-03: 30.5 via INTRAVENOUS
  Filled 2019-02-03: qty 31

## 2019-02-03 MED ORDER — TECHNETIUM TC 99M TETROFOSMIN IV KIT
11.0000 | PACK | Freq: Once | INTRAVENOUS | Status: AC | PRN
  Administered 2019-02-03: 11 via INTRAVENOUS
  Filled 2019-02-03: qty 11

## 2019-02-03 MED ORDER — REGADENOSON 0.4 MG/5ML IV SOLN
0.4000 mg | Freq: Once | INTRAVENOUS | Status: AC
  Administered 2019-02-03: 0.4 mg via INTRAVENOUS

## 2019-02-04 ENCOUNTER — Telehealth: Payer: Self-pay | Admitting: Nurse Practitioner

## 2019-02-04 ENCOUNTER — Telehealth: Payer: Self-pay | Admitting: *Deleted

## 2019-02-04 DIAGNOSIS — R931 Abnormal findings on diagnostic imaging of heart and coronary circulation: Secondary | ICD-10-CM

## 2019-02-04 MED ORDER — LOSARTAN POTASSIUM 50 MG PO TABS: 50.0000 mg | ORAL_TABLET | Freq: Every day | ORAL | 11 refills | Status: DC

## 2019-02-04 NOTE — Telephone Encounter (Signed)
-----   Message from Thayer Headings, MD sent at 02/04/2019 11:30 AM EDT ----- He has had a moderate - large ant. MI Reduced EF Needs to be started on ARB and spironolactone In addition to his beta blocker Please start Losartan 50  mg a day .  Check bmp in 3 weeks.  Needs follow up with me or APP in 2-3 months .  Anticipate adding spiro at a later appt.

## 2019-02-04 NOTE — Telephone Encounter (Signed)
Reviewed results with patient's wife, Shirlean Mylar. She verbalized understanding and agreement with plan. Patient has echo scheduled for 6/15. She will pick up losartan and is aware that patient should go to ED if chest pain reoccurs. I rescheduled patient's follow-up appointment to 6/23 with Dr. Acie Fredrickson per his advice due to starting losartan this week. I advised Shirlean Mylar to call back with questions or concerns prior to that time and she thanked me for the call.

## 2019-02-04 NOTE — Telephone Encounter (Signed)
-----   Message from Isaiah Serge, NP sent at 02/03/2019 11:17 PM EDT ----- Dr. Acie Fredrickson pt with episode of chest pain, nuc study no ischemia but with decrease in EF will check Echo.  Any other recommendations?  Anderson Malta can you arrange an echo.  To evaluate his pump action.

## 2019-02-05 ENCOUNTER — Telehealth (HOSPITAL_COMMUNITY): Payer: Self-pay | Admitting: Radiology

## 2019-02-05 NOTE — Telephone Encounter (Signed)
Spoke with wife-gave instructions for echocardiogram.

## 2019-02-08 ENCOUNTER — Ambulatory Visit (HOSPITAL_COMMUNITY): Payer: Managed Care, Other (non HMO) | Attending: Cardiovascular Disease

## 2019-02-08 ENCOUNTER — Other Ambulatory Visit: Payer: Self-pay

## 2019-02-08 DIAGNOSIS — R931 Abnormal findings on diagnostic imaging of heart and coronary circulation: Secondary | ICD-10-CM | POA: Insufficient documentation

## 2019-02-11 ENCOUNTER — Telehealth: Payer: Managed Care, Other (non HMO) | Admitting: Cardiology

## 2019-02-15 ENCOUNTER — Telehealth: Payer: Self-pay

## 2019-02-15 NOTE — Telephone Encounter (Signed)

## 2019-02-16 ENCOUNTER — Telehealth (INDEPENDENT_AMBULATORY_CARE_PROVIDER_SITE_OTHER): Payer: Managed Care, Other (non HMO) | Admitting: Cardiovascular Disease

## 2019-02-16 ENCOUNTER — Other Ambulatory Visit: Payer: Self-pay

## 2019-02-16 ENCOUNTER — Encounter: Payer: Self-pay | Admitting: Cardiovascular Disease

## 2019-02-16 VITALS — BP 114/66 | HR 74 | Ht 72.0 in | Wt 187.0 lb

## 2019-02-16 DIAGNOSIS — Z7189 Other specified counseling: Secondary | ICD-10-CM | POA: Diagnosis not present

## 2019-02-16 DIAGNOSIS — I2510 Atherosclerotic heart disease of native coronary artery without angina pectoris: Secondary | ICD-10-CM | POA: Diagnosis not present

## 2019-02-16 DIAGNOSIS — I5022 Chronic systolic (congestive) heart failure: Secondary | ICD-10-CM

## 2019-02-16 DIAGNOSIS — E782 Mixed hyperlipidemia: Secondary | ICD-10-CM | POA: Diagnosis not present

## 2019-02-16 MED ORDER — SPIRONOLACTONE 25 MG PO TABS: 12.5000 mg | ORAL_TABLET | Freq: Every day | ORAL | 3 refills | Status: DC

## 2019-02-16 MED ORDER — NITROGLYCERIN 0.4 MG SL SUBL: 0.4000 mg | SUBLINGUAL_TABLET | SUBLINGUAL | 1 refills | Status: DC | PRN

## 2019-02-16 NOTE — Progress Notes (Signed)
Virtual Visit via Telephone Note   This visit type was conducted due to national recommendations for restrictions regarding the COVID-19 Pandemic (e.g. social distancing) in an effort to limit this patient's exposure and mitigate transmission in our community.  Due to his co-morbid illnesses, this patient is at least at moderate risk for complications without adequate follow up.  This format is felt to be most appropriate for this patient at this time.  The patient did not have access to video technology/had technical difficulties with video requiring transitioning to audio format only (telephone).  All issues noted in this document were discussed and addressed.  No physical exam could be performed with this format.  Please refer to the patient's chart for his  consent to telehealth for [facility].   Date:  02/16/2019   ID:  NICKOLAS CHALFIN, DOB [birth year], MRN [id number]  Patient Location: Home Provider Location: Office  PCP:  Yvone Neu, MD  Cardiologist:  Mertie Moores, MD  Electrophysiologist:  None   Problem list 1. Coronary artery disease-status post coronary artery bypass graft - March 2018.  2. Hypertension 3. Hyperlipidemia      Chief Complaint  Patient presents with  . Follow-up    CAD    History of Present Illness:    MOHMMAD SALEEBY is a 63 y.o. male with a hx of CAD I met him in the hospital in March when he presented with CP Cath showed tight stenosis of LAD and RCA.  He had CABG on March 9.  He is eager to return to work  No angina .  Has the usual chest soreness.   Lipids from primary MD Chol - 128 HDL - 57 Trig - 51 LDL - 58  Sept. 11, 2018  Kobe is seen today for follow up of his CAD/ CABG. Also has hx of HTN and hyperlipidemia      Evaluation Performed:  Follow-Up Visit  Chief Complaint:   CAD   February 16, 2019   PETR BONTEMPO is a 63 y.o. male with a history of coronary artery disease.  Seen by Lajuana Matte, NP on  January 28, 2019 a recent episode of midsternal chest pain.  He took several nitroglycerin but the pain did not resolve. Stress Myoview study revealed the presence of a large anterior wall function.  His ejection fraction was 48%.  There is no evidence of ischemia. Cardiogram confirmed the presence of mild to moderate ejection fraction by echocardiogram is 35 to 40%. He was started on losartan 50 mg a day the plan was to start spironolactone appointment.  Feels fatigued.  No CP ,  Mild DOE  Exercising .  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   Avoiding salt    The patient does not have symptoms concerning for COVID-19 infection (fever, chills, cough, or new shortness of breath).    Past Medical History:  Diagnosis Date  . Carotid stenosis    pre CABG [place] 2/62: 0-35% LICA   . Coronary artery disease    s/p PCI to LAD 2010; Cath 8/13: 1v CAD w/ patent stent in mLAD & mod non flow limiting disease beyond the stented segment, mild non-obstructive dz in LCx and RCA, nl LV systolic fxn, no evidence of aortic dissection // LHC 3/18: oLAD stent ok, dLAD 90, OM1 30, pRCA 100 with L-R collats, EF 55-66 >> CABG  . GERD (gastroesophageal reflux disease)   . HLD (hyperlipidemia)   . Hypertension   .  Ulcer    Past Surgical History:  Procedure Laterality Date  . CARDIAC CATHETERIZATION    . CERVICAL SPINE SURGERY    . CHOLECYSTECTOMY    . CORONARY ANGIOPLASTY WITH STENT PLACEMENT    . CORONARY ARTERY BYPASS GRAFT N/A 11/01/2016   Procedure: CORONARY ARTERY BYPASS GRAFTING (CABG) TIMES TWO USING BILATERAL INTERNAL MAMMARY ARTERIES;  Surgeon: Gaye Pollack, MD;  Location: [place];  Service: Open Heart Surgery;  Laterality: N/A;  . LEFT HEART CATH AND CORONARY ANGIOGRAPHY N/A 10/28/2016   Procedure: Left Heart Cath and Coronary Angiography;  Surgeon: Leonie Man, MD;  Location: [place] CV LAB;  Service: Cardiovascular;  Laterality: N/A;  . LEFT HEART CATHETERIZATION WITH CORONARY ANGIOGRAM  N/A 04/20/2012   Procedure: LEFT HEART CATHETERIZATION WITH CORONARY ANGIOGRAM;  Surgeon: Burnell Blanks, MD;  Location: [facility] CATH LAB;  Service: Cardiovascular;  Laterality: N/A;  . TEE WITHOUT CARDIOVERSION N/A 11/01/2016   Procedure: TRANSESOPHAGEAL ECHOCARDIOGRAM (TEE);  Surgeon: Gaye Pollack, MD;  Location: [place];  Service: Open Heart Surgery;  Laterality: N/A;     Current Meds  Medication Sig  . aspirin 81 MG tablet Take 1 tablet (81 mg total) by mouth daily.  Marland Kitchen losartan (COZAAR) 50 MG tablet Take 1 tablet (50 mg total) by mouth daily.  . metoprolol tartrate (LOPRESSOR) 25 MG tablet Take 0.5 tablets (12.5 mg total) by mouth 2 (two) times daily.     Allergies:   No known allergies   Social History   Tobacco Use  . Smoking status: Never Smoker  . Smokeless tobacco: Never Used  Substance Use Topics  . Alcohol use: Yes    Comment: rare  . Drug use: No     Family Hx: The patient's family history includes CAD (age of onset: 28) in his brother; Heart attack (age of onset: 82) in his mother; Heart attack (age of onset: 70) in his father.  ROS:   Please see the history of present illness.     All other systems reviewed and are negative.   Prior CV studies:   The following studies were reviewed today:    Labs/Other Tests and Data Reviewed:    EKG:  No ECG reviewed.  Recent Labs: 01/28/2019: BUN 21; Creatinine, Ser 1.26; Hemoglobin 14.5; Platelets 184; Potassium 4.2; Sodium 142   Recent Lipid Panel Lab Results  Component Value Date/Time   CHOL 148 05/06/2017 08:11 AM   TRIG 56 05/06/2017 08:11 AM   HDL 57 05/06/2017 08:11 AM   CHOLHDL 2.6 05/06/2017 08:11 AM   CHOLHDL 2.6 10/26/2016 02:32 AM   LDLCALC 80 05/06/2017 08:11 AM    Wt Readings from Last 3 Encounters:  02/16/19 187 lb (84.8 kg)  02/03/19 187 lb (84.8 kg)  01/28/19 187 lb 1.9 oz (84.9 kg)     Objective:    Vital Signs:  BP 114/66 (BP Location: Right Arm, Patient Position: Sitting, Cuff Size:  Normal)   Pulse 74   Ht 6' (1.829 m)   Wt 187 lb (84.8 kg)   BMI 25.36 kg/m      ASSESSMENT & PLAN:    1. Coronary artery disease: The patient has a history of coronary artery bypass grafting.  He had some intense chest pain a month ago.  Myoview study revealed the presence of a large anterior wall infarction but did not reveal any ischemia.  His ejection fraction was found to be between 35 and 40% by echocardiogram.  We started him on losartan 50  mg a day.  We will start him on spironolactone 12.5 mg a day.  Will check labs in 3 weeks including a basic metabolic profile, lipid profile, liver enzymes.  I have advised him to call me right away if he has any further episodes of chest pain.  2 hyperlipidemia: We will draw fasting lipids in 3 weeks.  2.  Chronic systolic congestive heart failure: We have started him on losartan 50 mg a day.  We have now added spironolactone 12.5 mg a day.  He is on metoprolol.  Advised him  to avoid eating excessive salt.  COVID-19 Education: The signs and symptoms of COVID-19 were discussed with the patient and how to seek care for testing (follow up with PCP or arrange E-visit).  The importance of social distancing was discussed today.  Time:   Today, I have spent  19  minutes with the patient with telehealth technology discussing the above problems.     Medication Adjustments/Labs and Tests Ordered: Current medicines are reviewed at length with the patient today.  Concerns regarding medicines are outlined above.   Tests Ordered: Orders Placed This Encounter  Procedures  . Basic metabolic panel  . Hepatic function panel  . Lipid panel    Medication Changes: Meds ordered this encounter  Medications  . spironolactone (ALDACTONE) 25 MG tablet    Sig: Take 0.5 tablets (12.5 mg total) by mouth daily.    Dispense:  45 tablet    Refill:  3  . nitroGLYCERIN (NITROSTAT) 0.4 MG SL tablet    Sig: Place 1 tablet (0.4 mg total) under the tongue every 5  (five) minutes as needed for chest pain.    Dispense:  25 tablet    Refill:  1    Follow Up:  In Person in 6 week(s)  Signed, Mertie Moores, MD  02/16/2019 5:09 PM    [place]

## 2019-02-16 NOTE — Patient Instructions (Signed)
Medication Instructions:  1) START ALDACTONE 12.5 mg daily  2) Your nitroglycerin has been refilled.  Labwork: Your provider recommends that you return for FASTING lab work in 2-3 weeks.    Follow-Up: Your provider recommends that you schedule a follow-up appointment in: 4-6 weeks with Dr. Acie Fredrickson or his assistant.

## 2019-03-03 ENCOUNTER — Other Ambulatory Visit: Payer: Managed Care, Other (non HMO)

## 2019-03-03 ENCOUNTER — Other Ambulatory Visit: Payer: Self-pay

## 2019-03-03 ENCOUNTER — Encounter (INDEPENDENT_AMBULATORY_CARE_PROVIDER_SITE_OTHER): Payer: Self-pay

## 2019-03-03 DIAGNOSIS — I2510 Atherosclerotic heart disease of native coronary artery without angina pectoris: Secondary | ICD-10-CM

## 2019-03-03 LAB — BASIC METABOLIC PANEL
BUN/Creatinine Ratio: 22 (ref 10–24)
BUN: 20 mg/dL (ref 8–27)
CO2: 25 mmol/L (ref 20–29)
Calcium: 9.7 mg/dL (ref 8.6–10.2)
Chloride: 102 mmol/L (ref 96–106)
Creatinine, Ser: 0.93 mg/dL (ref 0.76–1.27)
GFR calc Af Amer: 99 mL/min/{1.73_m2} (ref >59)
GFR calc non Af Amer: 86 mL/min/{1.73_m2} (ref >59)
Glucose: 79 mg/dL (ref 65–99)
Potassium: 4.3 mmol/L (ref 3.5–5.2)
Sodium: 140 mmol/L (ref 134–144)

## 2019-03-03 LAB — HEPATIC FUNCTION PANEL
ALT: 14 IU/L (ref 0–44)
AST: 18 IU/L (ref 0–40)
Albumin: 4.2 g/dL (ref 3.8–4.8)
Alkaline Phosphatase: 53 IU/L (ref 39–117)
Bilirubin Total: 0.7 mg/dL (ref 0.0–1.2)
Bilirubin, Direct: 0.19 mg/dL (ref 0.00–0.40)
Total Protein: 6.1 g/dL (ref 6.0–8.5)

## 2019-03-03 LAB — LIPID PANEL
Chol/HDL Ratio: 2.3 ratio (ref 0.0–5.0)
Cholesterol, Total: 150 mg/dL (ref 100–199)
HDL: 64 mg/dL (ref >39)
LDL Calculated: 74 mg/dL (ref 0–99)
Triglycerides: 60 mg/dL (ref 0–149)
VLDL Cholesterol Cal: 12 mg/dL (ref 5–40)

## 2019-03-15 NOTE — Progress Notes (Signed)
Date:  03/16/2019   ID:  RIVER AMBROSIO, DOB June 13, 1956, MRN [id number]    PCP:  Yvone Neu, MD  Cardiologist:  Mertie Moores, MD  Electrophysiologist:  None   Problem list 1. Coronary artery disease-status post coronary artery bypass graft - March 2018.  2. Hypertension 3. Hyperlipidemia      Chief Complaint  Patient presents with  . Follow-up    CAD    History of Present Illness:    Ricky Hughes is a 63 y.o. male with a hx of CAD I met him in the hospital in March when he presented with CP Cath showed tight stenosis of LAD and RCA.  He had CABG on March 9.  He is eager to return to work  No angina .  Has the usual chest soreness.   Lipids from primary MD Chol - 128 HDL - 57 Trig - 51 LDL - 58  Sept. 11, 2018  Ricky Hughes is seen today for follow up of his CAD/ CABG. Also has hx of HTN and hyperlipidemia      Evaluation Performed:  Follow-Up Visit  Chief Complaint:   CAD   February 16, 2019   [name] is a 63 y.o. male with a history of coronary artery disease.  Seen by Lajuana Matte, NP on January 28, 2019 a recent episode of midsternal chest pain.  He took several nitroglycerin but the pain did not resolve. Stress Myoview study revealed the presence of a large anterior wall function.  His ejection fraction was 48%.  There is no evidence of ischemia. Cardiogram confirmed the presence of mild to moderate ejection fraction by echocardiogram is 35 to 40%. He was started on losartan 50 mg a day the plan was to start spironolactone at his next appointment.  Feels fatigued.  No CP ,  Mild DOE  Exercising .  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  He seems to be making progress.  He states that his energy levels are better and he is not having nearly the  shortness of breath.  The patient does not have symptoms concerning for COVID-19 infection (fever, chills, cough, or new shortness of breath).    Past Medical History:  Diagnosis Date  . Carotid stenosis    pre CABG [place] 3/00: 9-23% LICA   . Coronary artery disease    s/p PCI to LAD 2010; Cath 8/13: 1v CAD w/ patent stent in mLAD & mod non flow limiting disease beyond the stented segment, mild non-obstructive dz in LCx and RCA, nl LV systolic fxn, no evidence of aortic dissection // LHC 3/18: oLAD stent ok, dLAD 90, OM1 30, pRCA 100 with L-R collats, EF 55-66 >> CABG  . GERD (gastroesophageal reflux disease)   . HLD (hyperlipidemia)   . Hypertension   . Ulcer    Past Surgical History:  Procedure Laterality Date  . CARDIAC CATHETERIZATION    . CERVICAL SPINE SURGERY    . CHOLECYSTECTOMY    . CORONARY ANGIOPLASTY WITH STENT PLACEMENT    . CORONARY ARTERY BYPASS GRAFT N/A 11/01/2016   Procedure: CORONARY ARTERY BYPASS GRAFTING (CABG) TIMES TWO USING BILATERAL INTERNAL MAMMARY ARTERIES;  Surgeon: Gaye Pollack, MD;  Location: [place];  Service: Open Heart Surgery;  Laterality: N/A;  .  LEFT HEART CATH AND CORONARY ANGIOGRAPHY N/A 10/28/2016   Procedure: Left Heart Cath and Coronary Angiography;  Surgeon: Leonie Man, MD;  Location: [place] CV LAB;  Service: Cardiovascular;  Laterality: N/A;  . LEFT HEART CATHETERIZATION WITH CORONARY ANGIOGRAM N/A 04/20/2012   Procedure: LEFT HEART CATHETERIZATION WITH CORONARY ANGIOGRAM;  Surgeon: Burnell Blanks, MD;  Location: [facility] CATH LAB;  Service: Cardiovascular;  Laterality: N/A;  . TEE WITHOUT CARDIOVERSION N/A 11/01/2016   Procedure: TRANSESOPHAGEAL ECHOCARDIOGRAM (TEE);  Surgeon: Gaye Pollack, MD;  Location: [place];  Service: Open Heart Surgery;  Laterality: N/A;     Current Meds  Medication Sig  . aspirin 81 MG tablet Take 1 tablet (81 mg total) by mouth daily.  . metoprolol tartrate (LOPRESSOR) 25 MG tablet Take 0.5 tablets (12.5 mg  total) by mouth 2 (two) times daily.  . nitroGLYCERIN (NITROSTAT) 0.4 MG SL tablet Place 1 tablet (0.4 mg total) under the tongue every 5 (five) minutes as needed for chest pain.  Marland Kitchen spironolactone (ALDACTONE) 25 MG tablet Take 0.5 tablets (12.5 mg total) by mouth daily.  . [DISCONTINUED] losartan (COZAAR) 50 MG tablet Take 1 tablet (50 mg total) by mouth daily.     Allergies:   No known allergies   Social History   Tobacco Use  . Smoking status: Never Smoker  . Smokeless tobacco: Never Used  Substance Use Topics  . Alcohol use: Yes    Comment: rare  . Drug use: No     Family Hx: The patient's family history includes CAD (age of onset: 29) in his brother; Heart attack (age of onset: 73) in his mother; Heart attack (age of onset: 35) in his father.  ROS:   Please see the history of present illness.     All other systems reviewed and are negative.   Prior CV studies:   The following studies were reviewed today:    Labs/Other Tests and Data Reviewed:    EKG:  No ECG reviewed.  Recent Labs: 01/28/2019: Hemoglobin 14.5; Platelets 184 03/03/2019: ALT 14; BUN 20; Creatinine, Ser 0.93; Potassium 4.3; Sodium 140   Recent Lipid Panel Lab Results  Component Value Date/Time   CHOL 150 03/03/2019 08:42 AM   TRIG 60 03/03/2019 08:42 AM   HDL 64 03/03/2019 08:42 AM   CHOLHDL 2.3 03/03/2019 08:42 AM   CHOLHDL 2.6 10/26/2016 02:32 AM   LDLCALC 74 03/03/2019 08:42 AM    Wt Readings from Last 3 Encounters:  03/16/19 188 lb 12.8 oz (85.6 kg)  02/16/19 187 lb (84.8 kg)  02/03/19 187 lb (84.8 kg)     Objective:    Physical Exam: Blood pressure 132/74, pulse 63, height 6' (1.829 m), weight 188 lb 12.8 oz (85.6 kg), SpO2 98 %.  GEN:  Chronically ill man,  No acute  distress HEENT: Normal NECK: No JVD; No carotid bruits LYMPHATICS: No lymphadenopathy CARDIAC: RRR , no murmurs, rubs, gallops RESPIRATORY:  Clear to auscultation without rales, wheezing or rhonchi  ABDOMEN: Soft,  non-tender, non-distended MUSCULOSKELETAL:  No edema; No deformity  SKIN: Warm and dry NEUROLOGIC:  Alert and oriented x 3   ASSESSMENT & PLAN:    Coronary artery disease:   No angina   2 hyperlipidemia:    2.  Chronic systolic congestive heart failure: His ejection fraction remains low despite being on losartan, beta-blocker, spironolactone.  We will discontinue the losartan and start him on Entresto 49-51 mg twice a day.  We will have her return to  see [place] for a nurse visit on a day when I am in the office.  We will continue to try to titrate up his Entresto as tolerated.      Medication Adjustments/Labs and Tests Ordered: Current medicines are reviewed at length with the patient today.  Concerns regarding medicines are outlined above.   Tests Ordered: Orders Placed This Encounter  Procedures  . Basic Metabolic Panel (BMET)    Medication Changes: Meds ordered this encounter  Medications  . sacubitril-valsartan (ENTRESTO) 49-51 MG    Sig: Take 1 tablet by mouth 2 (two) times daily.    Dispense:  60 tablet    Refill:  11    Please Honor Card patient is presenting for Carmie Kanner: [id number]; Juanna Cao: [id number]; FWYOV: [id number]; ISSUER: [id number] ID: Pharmacy to complete    Follow Up:  In Person in 6 week(s)  Signed, Mertie Moores, MD  03/16/2019 5:10 PM    [place]

## 2019-03-16 ENCOUNTER — Other Ambulatory Visit: Payer: Self-pay

## 2019-03-16 ENCOUNTER — Ambulatory Visit (INDEPENDENT_AMBULATORY_CARE_PROVIDER_SITE_OTHER): Payer: Managed Care, Other (non HMO) | Admitting: Cardiovascular Disease

## 2019-03-16 ENCOUNTER — Encounter: Payer: Self-pay | Admitting: Cardiovascular Disease

## 2019-03-16 ENCOUNTER — Telehealth: Payer: Self-pay | Admitting: Cardiovascular Disease

## 2019-03-16 VITALS — BP 132/74 | HR 63 | Ht 72.0 in | Wt 188.8 lb

## 2019-03-16 DIAGNOSIS — I5022 Chronic systolic (congestive) heart failure: Secondary | ICD-10-CM

## 2019-03-16 DIAGNOSIS — R931 Abnormal findings on diagnostic imaging of heart and coronary circulation: Secondary | ICD-10-CM | POA: Diagnosis not present

## 2019-03-16 MED ORDER — ENTRESTO 49-51 MG PO TABS: 1.0000 | ORAL_TABLET | Freq: Two times a day (BID) | ORAL | 11 refills | Status: DC

## 2019-03-16 NOTE — Telephone Encounter (Signed)

## 2019-03-16 NOTE — Patient Instructions (Addendum)
Medication Instructions:  Your physician has recommended you make the following change in your medication:  START Entresto (sacubitril-valsartan) 49-51 mg twice daily STOP Losartan  If you need a refill on your cardiac medications before your next appointment, please call your pharmacy.   Lab work: Your physician recommends that you return for lab work on [place]. August 18  If you have labs (blood work) drawn today and your tests are completely normal, you will receive your results only by: Marland Kitchen MyChart Message (if you have MyChart) OR . A paper copy in the mail If you have any lab test that is abnormal or we need to change your treatment, we will call you to review the results.  Testing/Procedures: None Ordered   Follow-Up: Your physician recommends that you return for  Nurse Visit and Lab work on [employer]. August 19 at 11:15     Your physician recommends that you return for a follow-up appointment with Dr. Acie Fredrickson on Tuesday October 27 at 4:20 pm

## 2019-04-07 ENCOUNTER — Other Ambulatory Visit: Payer: Self-pay | Admitting: Cardiovascular Disease

## 2019-04-14 ENCOUNTER — Encounter: Payer: Self-pay | Admitting: *Deleted

## 2019-04-14 ENCOUNTER — Ambulatory Visit: Payer: Managed Care, Other (non HMO) | Admitting: *Deleted

## 2019-04-14 ENCOUNTER — Other Ambulatory Visit: Payer: Managed Care, Other (non HMO) | Admitting: *Deleted

## 2019-04-14 ENCOUNTER — Other Ambulatory Visit: Payer: Self-pay

## 2019-04-14 VITALS — BP 112/76 | HR 66 | Ht 72.0 in | Wt 182.0 lb

## 2019-04-14 DIAGNOSIS — I5022 Chronic systolic (congestive) heart failure: Secondary | ICD-10-CM

## 2019-04-14 DIAGNOSIS — R931 Abnormal findings on diagnostic imaging of heart and coronary circulation: Secondary | ICD-10-CM

## 2019-04-14 LAB — BASIC METABOLIC PANEL
BUN/Creatinine Ratio: 20 (ref 10–24)
BUN: 18 mg/dL (ref 8–27)
CO2: 24 mmol/L (ref 20–29)
Calcium: 9.6 mg/dL (ref 8.6–10.2)
Chloride: 101 mmol/L (ref 96–106)
Creatinine, Ser: 0.88 mg/dL (ref 0.76–1.27)
GFR calc Af Amer: 104 mL/min/{1.73_m2} (ref >59)
GFR calc non Af Amer: 90 mL/min/{1.73_m2} (ref >59)
Glucose: 90 mg/dL (ref 65–99)
Potassium: 4.4 mmol/L (ref 3.5–5.2)
Sodium: 140 mmol/L (ref 134–144)

## 2019-04-14 NOTE — Progress Notes (Signed)
Pt was here today for a nurse visit to check lab work and a BP check. Pt saw Dr. Cathie Olden on 03/16/19 Dr. Acie Fredrickson which at that time pt was started on Entresto 49/51 mg BID. Pt's BP today was taken in his right arm with a reading of 112/76, HR 66.   Pt states he is doing well on this dose and offers no complaints. Pt denies any sob, chest pain, edema, dizziness, HA, cough, fever, n/v. Pt states to me that he is feeling fine.   I then reviewed BP reading with Dr. Acie Fredrickson before letting the pt go. Advised pt per Dr. Acie Fredrickson continue on current Tx plan including Entresto 49/51 mg BID. Keep his f/u in 05/2019. Pt is agreeable to plan of care. I assured the pt once we have his lab results in from today we will call him with the results. Pt was thankful of our office monitoring his BP after starting new medication.

## 2019-04-23 ENCOUNTER — Other Ambulatory Visit: Payer: Self-pay | Admitting: Cardiovascular Disease

## 2019-06-21 ENCOUNTER — Encounter: Payer: Self-pay | Admitting: Cardiovascular Disease

## 2019-06-22 ENCOUNTER — Ambulatory Visit: Payer: Managed Care, Other (non HMO) | Admitting: Cardiovascular Disease

## 2019-08-04 ENCOUNTER — Ambulatory Visit: Payer: Managed Care, Other (non HMO) | Admitting: Cardiovascular Disease

## 2020-07-05 ENCOUNTER — Telehealth: Payer: Self-pay | Admitting: Cardiovascular Disease

## 2020-07-05 NOTE — Telephone Encounter (Signed)
Left message to call office

## 2020-07-05 NOTE — Telephone Encounter (Signed)
Patient's wife returning call. 

## 2020-07-05 NOTE — Telephone Encounter (Signed)
I spoke with patient's wife. She reports patient has been having pain and weakness in left leg for about 3 weeks.  He has knot behind left knee. He has some swelling in both ankles. Has indentation in ankles when he takes socks off.  Does not weigh daily. He saw PCP recently regarding left leg weakness.  Wife does not know if patient told PCP about knot.  He has ultrasound scheduled for this Friday. I asked wife to follow up with PCP and make them aware of knot since they have ordered testing for leg.  Wife is concerned about waiting until Friday and would like evaluation sooner.  I asked her to contact PCP to see if test could be done sooner.  She requests evaluation by our office.

## 2020-07-05 NOTE — Telephone Encounter (Signed)
New Message:     Wife called, she said she would like for pt to be seen today. Left leg is hurtin and it is really weak. There is also a knot in the bend of his leg. She is very concerned about it, wearied about it being a blood clot.   Pt also have swelling in his ankles  Pt c/o swelling: STAT is pt has developed SOB within 24 hours  1) How much weight have you gained and in what time span? no  2) If swelling, where is the swelling located? ankles  3) Are you currently taking a fluid pill? no  4) Are you currently SOB? Yes ,off and on 5) Do you have a log of your daily weights (if so, list)? no  6) Have you gained 3 pounds in a day or 5 pounds in a week? no  7) Have you traveled recently? no

## 2020-07-05 NOTE — Telephone Encounter (Signed)
Reviewed with Dr Clifton James (DOD) who recommends patient contact PCP to request evaluation or sooner ultrasound appointment. If unable to have testing or appointment sooner patient should go to Urgent Care.  I spoke with patient's wife and gave her this information.  She is asking about a [facility] urgent care and I gave her address for Urgent Care on the campus of [facility].  Patient is due for follow up in our office.  Wife requests in office appointment.  I scheduled patient to see Tereso Newcomer, PA on January 3,2022 at 3:15.  She will have PCP or urgent care contact office if sooner appointment needed.

## 2020-07-05 NOTE — Telephone Encounter (Signed)
Agree. Thanks

## 2020-08-28 ENCOUNTER — Ambulatory Visit: Payer: Self-pay | Admitting: Physician Assistant

## 2020-08-28 NOTE — Progress Notes (Deleted)
Cardiology Office Note:    Date:  08/28/2020   ID:  Ricky Hughes, DOB 08-18-1956, MRN [id number]  PCP:  Maximiano Coss, MD  [facility] Cardiologist:  Kristeen Miss, MD *** [facility] Electrophysiologist:  None   Referring MD: Maximiano Coss   Chief Complaint:  No chief complaint on file.    Patient Profile:    Ricky Hughes is a 65 y.o. male with:   Coronary artery disease   S/p prior stenting to the LAD  Hx of prior MI (Myoview in 2018 w/ old inf infarct; 2021: anterior infarct)  S/p CABG in 10/2016 (L-LAD, free RIMA-PDA)  Myoview 01/2019: EF 48, Large anterior MI, no ischemia   Heart failure with reduced ejection fraction   Ischemic CM  Echocardiogram 01/2019: EF 35-40   Hypertension   Hyperlipidemia   Prior CV studies: Echocardiogram 02/08/2019 EF 35-40, mild LVH, inf and apical HK, normal RVSF, AV sclerosis w/o stenosis   Myoview 02/03/2019 EF 48, Lg ant infarct; no ischemia  TEE 11/01/16 EF 55-60  Carotid US 3/18 L 1-39  LHC 10/28/16 LAD ost stent ok, dist 90 LCx with OM1 30 RCA prox 100 with L-R collats EF 55-65, 2+ MR  History of Present Illness:    Mr. Sawyer was last seen in clinic by Dr. Elease Hashimoto in 02/2019.  He has a hx of heart failure with reduced ejection fraction diagnosed last year with an EF of 35-40.  He had had an episode of chest pain and a Myoview showed a large anterior infarct but no ischemia.  His medications have been adjusted to get him on GDMT.  He canceled his appt with Dr. Elease Hashimoto in 05/2020 and returns today for f/u.  ***      Past Medical History:  Diagnosis Date  . Carotid stenosis    pre CABG [place] 1/95: 1-39% LICA   . Coronary artery disease    s/p PCI to LAD 2010; Cath 8/13: 1v CAD w/ patent stent in mLAD & mod non flow limiting disease beyond the stented segment, mild non-obstructive dz in LCx and RCA, nl LV systolic fxn, no evidence of aortic dissection // LHC 3/18: oLAD stent ok, dLAD 90, OM1 30,  pRCA 100 with L-R collats, EF 55-66 >> CABG  . GERD (gastroesophageal reflux disease)   . HLD (hyperlipidemia)   . Hypertension   . Ulcer     Current Medications: No outpatient medications have been marked as taking for the 08/28/20 encounter (Appointment) with Tereso Newcomer T, PA-C.     Allergies:   No known allergies   Social History   Tobacco Use  . Smoking status: Never Smoker  . Smokeless tobacco: Never Used  Vaping Use  . Vaping Use: Never used  Substance Use Topics  . Alcohol use: Yes    Comment: rare  . Drug use: No     Family Hx: The patient's family history includes CAD (age of onset: 22) in his brother; Heart attack (age of onset: 36) in his mother; Heart attack (age of onset: 2) in his father.  ROS   EKGs/Labs/Other Test Reviewed:    EKG:  EKG is *** ordered today.  The ekg ordered today demonstrates ***  Recent Labs: No results found for requested labs within last 8760 hours.   Recent Lipid Panel Lab Results  Component Value Date/Time   CHOL 150 03/03/2019 08:42 AM   TRIG 60 03/03/2019 08:42 AM   HDL 64 03/03/2019 08:42 AM  CHOLHDL 2.3 03/03/2019 08:42 AM   CHOLHDL 2.6 10/26/2016 02:32 AM   LDLCALC 74 03/03/2019 08:42 AM      Risk Assessment/Calculations:   {Does this patient have ATRIAL FIBRILLATION?:[id number]}  Physical Exam:    VS:  There were no vitals taken for this visit.    Wt Readings from Last 3 Encounters:  04/14/19 182 lb (82.6 kg)  03/16/19 188 lb 12.8 oz (85.6 kg)  02/16/19 187 lb (84.8 kg)     Physical Exam ***  ASSESSMENT & PLAN:    ***1. Coronary artery disease involving native coronary artery of native heart without angina pectoris -  s/p CABG. He is recovering slowly. He does have some discomfort over his lower ribs on the right as well as his right clavicle. This seems to be more related to positional changes. I recommended heat to the areas. He can call us if the symptoms do not improve or worsen. His appetite is  poor and I have recommended Ensure or Boost to supplement his diet until his appetite improves.               -  Continue ASA, Plavix, statin, beta blocker             -  Refer to [facility] in [place]             -  FU with Dr. Laneta Simmers as planned.   2. Essential hypertension -  BP is s/w low. DC Lisinopril for now.  Reduced Metoprolol to 12.5 mg bid.   3. Pure hypercholesterolemia - With CAD and recent CABG, LDL should be < 70.  Will increase Lipitor to 80 QD. Check Lipids and LFTs in 6 weeks.   {Are you ordering a CV Procedure (e.g. stress test, cath, DCCV, TEE, etc)?   Press F2        :[id number]}    Dispo:  No follow-ups on file.   Medication Adjustments/Labs and Tests Ordered: Current medicines are reviewed at length with the patient today.  Concerns regarding medicines are outlined above.  Tests Ordered: No orders of the defined types were placed in this encounter.  Medication Changes: No orders of the defined types were placed in this encounter.   Signed, Tereso Newcomer, PA-C  08/28/2020 1:20 PM    [facility] [address] [place], [place], [place]  [address] Phone: [phone]; Fax: [phone]

## 2021-04-25 ENCOUNTER — Telehealth: Payer: Self-pay | Admitting: Cardiovascular Disease

## 2021-04-25 MED ORDER — NITROGLYCERIN 0.4 MG SL SUBL: 0.4000 mg | SUBLINGUAL_TABLET | SUBLINGUAL | 1 refills | Status: DC | PRN

## 2021-04-25 NOTE — Telephone Encounter (Signed)
Pts wife calling today to report that pt has been having on and off CP for the past couple of days. It has become more frequent over the past few days. He described the chest pain as a "squeeze." She states he is SOB when going up stairs, relieved with rest. He has not tried nitro. He does not want to go to the ED.  I advised pts wife that I believed he should visit the ED for evaluation as his CP has become more frequent in the last few days. He lives in [place], so I advised him to visit that ED for CP eval. Since he was not yet willing to visit the ED, he was placed on Dr. Harvie Bridge schedule for Friday in a DOD spot. He was strongly encouraged to visit the ED especially if pain continues after nitro tabs.  New nitro script sent. His current tabs are > 2 yo.  Will send to MD for review.

## 2021-04-25 NOTE — Telephone Encounter (Signed)
Follow up:     Patient at work wife is calling

## 2021-04-25 NOTE — Addendum Note (Signed)
Addended by: Oretha Milch on: 04/25/2021 10:22 AM   Modules accepted: Orders

## 2021-04-25 NOTE — Telephone Encounter (Signed)
Pt c/o of Chest Pain: 1. Are you having CP right now? Yes  2. Are you experiencing any other symptoms (ex. SOB, nausea, vomiting, sweating)? No  3. How long have you been experiencing CP? Off and on each day  4. Is your CP continuous or coming and going? Coming and going 5. Have you taken Nitroglycerin? Yes last few days.

## 2021-04-27 ENCOUNTER — Encounter: Payer: Self-pay | Admitting: Cardiovascular Disease

## 2021-04-27 ENCOUNTER — Ambulatory Visit (INDEPENDENT_AMBULATORY_CARE_PROVIDER_SITE_OTHER): Payer: BC Managed Care – PPO | Admitting: Cardiovascular Disease

## 2021-04-27 ENCOUNTER — Other Ambulatory Visit: Payer: Self-pay

## 2021-04-27 VITALS — BP 160/100 | HR 62 | Ht 72.0 in | Wt 187.2 lb

## 2021-04-27 DIAGNOSIS — I2510 Atherosclerotic heart disease of native coronary artery without angina pectoris: Secondary | ICD-10-CM | POA: Diagnosis not present

## 2021-04-27 DIAGNOSIS — I5043 Acute on chronic combined systolic (congestive) and diastolic (congestive) heart failure: Secondary | ICD-10-CM | POA: Diagnosis not present

## 2021-04-27 DIAGNOSIS — R0789 Other chest pain: Secondary | ICD-10-CM | POA: Diagnosis not present

## 2021-04-27 DIAGNOSIS — E782 Mixed hyperlipidemia: Secondary | ICD-10-CM

## 2021-04-27 MED ORDER — ENTRESTO 49-51 MG PO TABS: 1.0000 | ORAL_TABLET | Freq: Two times a day (BID) | ORAL | 11 refills | Status: AC

## 2021-04-27 NOTE — Progress Notes (Signed)
Ricky Hughes is seen      Date:  04/27/2021   ID:  Ricky Hughes, DOB November 14, 1955, MRN [id number]    PCP:  Yvone Neu, MD  Cardiologist:  Mertie Moores, MD  Electrophysiologist:  None   Problem list 1. Coronary artery disease-status post coronary artery bypass graft - March 2018.  2. Hypertension 3. Hyperlipidemia       Chief Complaint  Patient presents with   Follow-up      CAD     History of Present Illness:     Ricky Hughes is a 65 y.o. male with a hx of CAD I met him in the hospital in March when he presented with CP Cath showed tight stenosis of LAD and RCA.  He had CABG on March 9.  He is eager to return to work  No angina .  Has the usual chest soreness.    Lipids from primary MD Chol - 128 HDL - 57 Trig - 51 LDL - 58   Sept. 11, 2018   Niraj is seen today for follow up of his CAD/ CABG. Also has hx of HTN and hyperlipidemia       Evaluation Performed:  Follow-Up Visit  Chief Complaint:   CAD   February 16, 2019   Ricky Hughes is a 65 y.o. male with a history of coronary artery disease.  Seen by Lajuana Matte, NP on January 28, 2019 a recent episode of midsternal chest pain.  He took several nitroglycerin but the pain did not resolve. Stress Myoview study revealed the presence of a large anterior wall function.  His ejection fraction was 48%.  There is no evidence of ischemia. Cardiogram confirmed the presence of mild to moderate ejection fraction by echocardiogram is 35 to 40%. He was started on losartan 50 mg a day the plan was to start spironolactone at his next appointment.  Feels fatigued.  No CP ,  Mild DOE  Exercising .  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  He seems to be making progress.  He states that his energy levels are better and he is not having nearly  the shortness of breath.  The patient does not have symptoms concerning for COVID-19 infection (fever, chills, cough, or new shortness of breath).    April 27, 2021: Seen with wife,  Ricky Hughes is Today for follow-up of his coronary artery disease and congestive heart failure.  He has been having some episodes of chest discomfort recently.  Has not been taking his meds for the past year  CP feels like his previous episodes of CP   Has been seen for fracture of right humerus    Past Medical History:  Diagnosis Date   Carotid stenosis    pre CABG [place] 0/17: 7-93% LICA    Coronary artery disease    s/p PCI to LAD 2010; Cath 8/13: 1v CAD w/ patent stent in mLAD & mod non flow limiting disease beyond the stented segment, mild non-obstructive dz in LCx and RCA, nl LV systolic fxn, no evidence of aortic dissection // LHC 3/18: oLAD stent ok, dLAD 90, OM1 30, pRCA 100 with L-R collats, EF 55-66 >> CABG   GERD (gastroesophageal reflux disease)    HLD (hyperlipidemia)    Hypertension  Ulcer    Past Surgical History:  Procedure Laterality Date   CARDIAC CATHETERIZATION     CERVICAL SPINE SURGERY     CHOLECYSTECTOMY     CORONARY ANGIOPLASTY WITH STENT PLACEMENT     CORONARY ARTERY BYPASS GRAFT N/A 11/01/2016   Procedure: CORONARY ARTERY BYPASS GRAFTING (CABG) TIMES TWO USING BILATERAL INTERNAL MAMMARY ARTERIES;  Surgeon: Gaye Pollack, MD;  Location: [facility];  Service: Open Heart Surgery;  Laterality: N/A;   LEFT HEART CATH AND CORONARY ANGIOGRAPHY N/A 10/28/2016   Procedure: Left Heart Cath and Coronary Angiography;  Surgeon: Leonie Man, MD;  Location: [place] CV LAB;  Service: Cardiovascular;  Laterality: N/A;   LEFT HEART CATHETERIZATION WITH CORONARY ANGIOGRAM N/A 04/20/2012   Procedure: LEFT HEART CATHETERIZATION WITH CORONARY ANGIOGRAM;  Surgeon: Burnell Blanks, MD;  Location: [facility] CATH LAB;  Service: Cardiovascular;  Laterality: N/A;   TEE WITHOUT CARDIOVERSION N/A  11/01/2016   Procedure: TRANSESOPHAGEAL ECHOCARDIOGRAM (TEE);  Surgeon: Gaye Pollack, MD;  Location: [facility];  Service: Open Heart Surgery;  Laterality: N/A;     Current Meds  Medication Sig   aspirin 81 MG tablet Take 1 tablet (81 mg total) by mouth daily.   nitroGLYCERIN (NITROSTAT) 0.4 MG SL tablet Place 1 tablet (0.4 mg total) under the tongue every 5 (five) minutes as needed for chest pain.     Allergies:   No known allergies   Social History   Tobacco Use   Smoking status: Never   Smokeless tobacco: Never  Vaping Use   Vaping Use: Never used  Substance Use Topics   Alcohol use: Yes    Comment: rare   Drug use: No     Family Hx: The patient's family history includes CAD (age of onset: 32) in his brother; Heart attack (age of onset: 56) in his mother; Heart attack (age of onset: 62) in his father.  ROS:   Please see the history of present illness.     All other systems reviewed and are negative.   Prior CV studies:   The following studies were reviewed today:    Labs/Other Tests and Data Reviewed:    EKG:  No ECG reviewed.  Recent Labs: No results found for requested labs within last 8760 hours.   Recent Lipid Panel Lab Results  Component Value Date/Time   CHOL 150 03/03/2019 08:42 AM   TRIG 60 03/03/2019 08:42 AM   HDL 64 03/03/2019 08:42 AM   CHOLHDL 2.3 03/03/2019 08:42 AM   CHOLHDL 2.6 10/26/2016 02:32 AM   LDLCALC 74 03/03/2019 08:42 AM    Wt Readings from Last 3 Encounters:  04/27/21 187 lb 3.2 oz (84.9 kg)  04/14/19 182 lb (82.6 kg)  03/16/19 188 lb 12.8 oz (85.6 kg)     Objective:    Physical Exam: Blood pressure (!) 160/100, pulse 62, height 6' (1.829 m), weight 187 lb 3.2 oz (84.9 kg), SpO2 99 %.  GEN:  Well nourished, well developed in no acute distress HEENT: Normal NECK: No JVD; No carotid bruits LYMPHATICS: No lymphadenopathy CARDIAC: RRR , no murmurs, rubs, gallops RESPIRATORY:  Clear to auscultation without rales, wheezing or  rhonchi  ABDOMEN: Soft, non-tender, non-distended MUSCULOSKELETAL:  No edema; No deformity  SKIN: Warm and dry NEUROLOGIC:  Alert and oriented x 3  ECG: April 27, 2021: Normal sinus rhythm.  No ST or T wave changes.  ASSESSMENT & PLAN:    Coronary artery disease:   stable    2  hyperlipidemia:    2.  Acute on Chronic systolic / diastolc  congestive heart failure: He stopped all of his medications approximately 1 year ago.  He has been having some episodes of chest pain as well as some leg swelling.  He commented that he did not think he needed to keep taking them.  I have emphasized the importance of continuing to take his medications.  We will restart the Entresto 49-51 twice a day.  His heart rate is already fairly slow so we will not restart metoprolol at this time.  I will have him return in 2 to 3 weeks for basic metabolic profile.  We will see an APP in 2 to 3 months.  We will consider restarting spironolactone at that time.  We will also get an echocardiogram to see what his current LV function is.    Medication Adjustments/Labs and Tests Ordered: Current medicines are reviewed at length with the patient today.  Concerns regarding medicines are outlined above.   Tests Ordered: Orders Placed This Encounter  Procedures   Basic metabolic panel   EKG 89-VQXI   ECHOCARDIOGRAM COMPLETE     Medication Changes: Meds ordered this encounter  Medications   sacubitril-valsartan (ENTRESTO) 49-51 MG    Sig: Take 1 tablet by mouth 2 (two) times daily.    Dispense:  60 tablet    Refill:  11    Please Honor Card patient is presenting for Carmie Kanner: [id number]; Juanna Cao: [id number]; PHXTA: [id number]; ISSUER: [id number] ID: Pharmacy to complete     Follow Up:  In Person in 6 week(s)  Signed, Mertie Moores, MD  04/27/2021 11:01 AM    [place]

## 2021-04-27 NOTE — Patient Instructions (Signed)
Medication Instructions:  Your physician has recommended you make the following change in your medication:  1) RESTART taking Entresto 49-51 mg twice daily  *If you need a refill on your cardiac medications before your next appointment, please call your pharmacy*   Lab Work: BMET in 2-3 weeks If you have labs (blood work) drawn today and your tests are completely normal, you will receive your results only by: MyChart Message (if you have MyChart) OR A paper copy in the mail If you have any lab test that is abnormal or we need to change your treatment, we will call you to review the results.   Testing/Procedures: Your physician has requested that you have an echocardiogram. Echocardiography is a painless test that uses sound waves to create images of your heart. It provides your doctor with information about the size and shape of your heart and how well your heart's chambers and valves are working. This procedure takes approximately one hour. There are no restrictions for this procedure.  Follow-Up: At [facility], you and your health needs are our priority.  As part of our continuing mission to provide you with exceptional heart care, we have created designated Provider Care Teams.  These Care Teams include your primary Cardiologist (physician) and Advanced Practice Providers (APPs -  Physician Assistants and Nurse Practitioners) who all work together to provide you with the care you need, when you need it.  Your next appointment:   2-3 month(s)  The format for your next appointment:   In Person  Provider:   You will see one of the following Advanced Practice Providers on your designated Care Team:   Ricky Newcomer, PA-C Ricky Hughes, [place]

## 2021-05-02 ENCOUNTER — Other Ambulatory Visit: Payer: Self-pay | Admitting: Cardiovascular Disease

## 2021-05-21 ENCOUNTER — Other Ambulatory Visit: Payer: Self-pay

## 2021-05-21 ENCOUNTER — Other Ambulatory Visit: Payer: Self-pay | Admitting: Cardiovascular Disease

## 2021-05-21 ENCOUNTER — Other Ambulatory Visit: Payer: BC Managed Care – PPO | Admitting: *Deleted

## 2021-05-21 ENCOUNTER — Ambulatory Visit (HOSPITAL_COMMUNITY): Payer: BC Managed Care – PPO | Attending: Cardiology

## 2021-05-21 DIAGNOSIS — E782 Mixed hyperlipidemia: Secondary | ICD-10-CM

## 2021-05-21 DIAGNOSIS — I2510 Atherosclerotic heart disease of native coronary artery without angina pectoris: Secondary | ICD-10-CM

## 2021-05-21 DIAGNOSIS — R0789 Other chest pain: Secondary | ICD-10-CM

## 2021-05-21 LAB — BASIC METABOLIC PANEL
BUN/Creatinine Ratio: 23 (ref 10–24)
BUN: 20 mg/dL (ref 8–27)
CO2: 27 mmol/L (ref 20–29)
Calcium: 9.8 mg/dL (ref 8.6–10.2)
Chloride: 104 mmol/L (ref 96–106)
Creatinine, Ser: 0.88 mg/dL (ref 0.76–1.27)
Glucose: 72 mg/dL (ref 65–99)
Potassium: 3.9 mmol/L (ref 3.5–5.2)
Sodium: 144 mmol/L (ref 134–144)
eGFR: 94 mL/min/{1.73_m2} (ref >59)

## 2021-05-21 LAB — ECHOCARDIOGRAM COMPLETE
Area-P 1/2: 3.03 cm2
S' Lateral: 2.5 cm

## 2021-05-22 ENCOUNTER — Telehealth: Payer: Self-pay

## 2021-05-22 NOTE — Telephone Encounter (Signed)
-----   Message from Vesta Mixer, MD sent at 05/21/2021  8:57 PM EDT ----- Normal BMP

## 2021-05-22 NOTE — Telephone Encounter (Signed)
Attempted phone call to pt.  Per Epic OK to leave voicemail message on cell phone.  Advised per Dr Elease Hashimoto lab work is normal.  Please call [phone] for any questions.

## 2021-06-05 ENCOUNTER — Other Ambulatory Visit: Payer: Self-pay | Admitting: Cardiovascular Disease

## 2021-07-10 ENCOUNTER — Ambulatory Visit: Payer: Self-pay | Admitting: Cardiovascular Disease

## 2021-07-17 ENCOUNTER — Telehealth: Payer: Self-pay | Admitting: *Deleted

## 2021-07-17 NOTE — Telephone Encounter (Signed)
   Patient Name: Ricky Hughes  DOB: 03-18-1956 MRN: [id number]  Primary Cardiologist: Kristeen Miss, MD  Chart reviewed as part of pre-operative protocol coverage.   Simple dental extractions and cleanings are considered low risk procedures per guidelines and generally do not require any specific cardiac clearance. It is also generally accepted that for simple extractions and dental cleanings, there is no need to interrupt blood thinner therapy.  SBE prophylaxis is not required for the patient from a cardiac standpoint.  I will route this recommendation to the requesting party via Epic fax function and remove from pre-op pool.  Please call with questions.  Corrin Parker, PA-C 07/17/2021, 5:05 PM

## 2021-07-17 NOTE — Telephone Encounter (Signed)
   Pre-operative Risk Assessment    Patient Name: Ricky Hughes  DOB: 01-23-1956 MRN: [id number]     Request for Surgical Clearance   Procedure:   DENTAL CLEANING AND DENTAL CHECK UP  Date of Surgery: Clearance 07/28/21                                 Surgeon:  DR. Okey Dupre SATTERFIELD, DMD Surgeon's Group or Practice Name:  [facility] SATTERFIELD, DMD, [facility]  Phone number:  [phone] Fax number:  [phone]   Type of Clearance Requested: - Medical  - Pharmacy:  Hold Aspirin : DMD IS NEEDING TO KNOW IF PT WILL NEED SBE   Type of Anesthesia:   Local Numbing   Additional requests/questions:   Elpidio Anis   07/17/2021, 3:19 PM

## 2021-07-20 ENCOUNTER — Other Ambulatory Visit: Payer: Self-pay | Admitting: Cardiovascular Disease

## 2021-07-27 ENCOUNTER — Other Ambulatory Visit: Payer: Self-pay

## 2021-07-27 ENCOUNTER — Encounter: Payer: Self-pay | Admitting: Cardiovascular Disease

## 2021-07-27 ENCOUNTER — Ambulatory Visit (INDEPENDENT_AMBULATORY_CARE_PROVIDER_SITE_OTHER): Payer: BC Managed Care – PPO | Admitting: Cardiovascular Disease

## 2021-07-27 VITALS — BP 118/72 | HR 64 | Ht 72.0 in | Wt 194.2 lb

## 2021-07-27 DIAGNOSIS — E782 Mixed hyperlipidemia: Secondary | ICD-10-CM | POA: Diagnosis not present

## 2021-07-27 DIAGNOSIS — I2510 Atherosclerotic heart disease of native coronary artery without angina pectoris: Secondary | ICD-10-CM | POA: Diagnosis not present

## 2021-07-27 DIAGNOSIS — I5043 Acute on chronic combined systolic (congestive) and diastolic (congestive) heart failure: Secondary | ICD-10-CM | POA: Diagnosis not present

## 2021-07-27 NOTE — Patient Instructions (Signed)
Medication Instructions:  Your physician recommends that you continue on your current medications as directed. Please refer to the Current Medication list given to you today. CONTINUE: Aspirin 81 mg by mouth daily   *If you need a refill on your cardiac medications before your next appointment, please call your pharmacy*   Lab Work: TODAY:  Lipids and ALT  If you have labs (blood work) drawn today and your tests are completely normal, you will receive your results only by: MyChart Message (if you have MyChart) OR A paper copy in the mail If you have any lab test that is abnormal or we need to change your treatment, we will call you to review the results.   Testing/Procedures: NONE   Follow-Up: At [facility], you and your health needs are our priority.  As part of our continuing mission to provide you with exceptional heart care, we have created designated Provider Care Teams.  These Care Teams include your primary Cardiologist (physician) and Advanced Practice Providers (APPs -  Physician Assistants and Nurse Practitioners) who all work together to provide you with the care you need, when you need it.  We recommend signing up for the patient portal called "MyChart".  Sign up information is provided on this After Visit Summary.  MyChart is used to connect with patients for Virtual Visits (Telemedicine).  Patients are able to view lab/test results, encounter notes, upcoming appointments, etc.  Non-urgent messages can be sent to your provider as well.   To learn more about what you can do with MyChart, go to [email or web address].    Your next appointment:   6 month(s)  The format for your next appointment:   In Person  Provider:   Chelsea Aus, PA-C or Tereso Newcomer, PA-C        :1}

## 2021-07-27 NOTE — Progress Notes (Signed)
Tiny is seen      Date:  07/27/2021   ID:  Ricky Hughes, DOB 1956-04-08, MRN [id number]    PCP:  Yvone Neu, MD  Cardiologist:  Mertie Moores, MD  Electrophysiologist:  None   Problem list 1. Coronary artery disease-status post coronary artery bypass graft - March 2018.  2. Hypertension 3. Hyperlipidemia       Chief Complaint  Patient presents with   Follow-up      CAD     History of Present Illness:     Ricky Hughes is a 65 y.o. male with a hx of CAD I met him in the hospital in March when he presented with CP Cath showed tight stenosis of LAD and RCA.  He had CABG on March 9.  He is eager to return to work  No angina .  Has the usual chest soreness.    Lipids from primary MD Chol - 128 HDL - 57 Trig - 51 LDL - 58   Sept. 11, 2018   Ricky Hughes is seen today for follow up of his CAD/ CABG. Also has hx of HTN and hyperlipidemia       Evaluation Performed:  Follow-Up Visit  Chief Complaint:   CAD   February 16, 2019   Ricky Hughes is a 65 y.o. male with a history of coronary artery disease.  Seen by Lajuana Matte, NP on January 28, 2019 a recent episode of midsternal chest pain.  He took several nitroglycerin but the pain did not resolve. Stress Myoview study revealed the presence of a large anterior wall function.  His ejection fraction was 48%.  There is no evidence of ischemia. Cardiogram confirmed the presence of mild to moderate ejection fraction by echocardiogram is 35 to 40%. He was started on losartan 50 mg a day the plan was to start spironolactone at his next appointment.  Feels fatigued.  No CP ,  Mild DOE  Exercising .  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  He seems to be making progress.  He states that his energy levels are better and he is not having nearly  the shortness of breath.  The patient does not have symptoms concerning for COVID-19 infection (fever, chills, cough, or new shortness of breath).    April 27, 2021: Seen with wife,  Ricky Hughes is Today for follow-up of his coronary artery disease and congestive heart failure.  He has been having some episodes of chest discomfort recently.  Has not been taking his meds for the past year  CP feels like his previous episodes of CP   Has been seen for fracture of right humerus   July 27, 2021: Ricky Hughes is seen today with his wife, Ricky Hughes.  He has a history of coronary artery disease and congestive heart failure.  When I saw him in September, he had stopped all of his medications for the past year. We restarted  Delene Loll He is tolerating it fairly well Breathing is "pretty good" Sometimes short of breath at night  Eats bacon every couple of weeks.   Works at [employer] ,  lots of walking  EF was normal on Sept. 26.   Past Medical History:  Diagnosis Date   Carotid stenosis    pre CABG [place] 2/18: 1-39%  LICA    Coronary artery disease    s/p PCI to LAD 2010; Cath 8/13: 1v CAD w/ patent stent in mLAD & mod non flow limiting disease beyond the stented segment, mild non-obstructive dz in LCx and RCA, nl LV systolic fxn, no evidence of aortic dissection // LHC 3/18: oLAD stent ok, dLAD 90, OM1 30, pRCA 100 with L-R collats, EF 55-66 >> CABG   GERD (gastroesophageal reflux disease)    HLD (hyperlipidemia)    Hypertension    Ulcer    Past Surgical History:  Procedure Laterality Date   CARDIAC CATHETERIZATION     CERVICAL SPINE SURGERY     CHOLECYSTECTOMY     CORONARY ANGIOPLASTY WITH STENT PLACEMENT     CORONARY ARTERY BYPASS GRAFT N/A 11/01/2016   Procedure: CORONARY ARTERY BYPASS GRAFTING (CABG) TIMES TWO USING BILATERAL INTERNAL MAMMARY ARTERIES;  Surgeon: Gaye Pollack, MD;  Location: [place];  Service: Open Heart Surgery;  Laterality: N/A;   LEFT HEART CATH AND CORONARY ANGIOGRAPHY  N/A 10/28/2016   Procedure: Left Heart Cath and Coronary Angiography;  Surgeon: Leonie Man, MD;  Location: [place] [facility];  Service: Cardiovascular;  Laterality: N/A;   LEFT HEART CATHETERIZATION WITH CORONARY ANGIOGRAM N/A 04/20/2012   Procedure: LEFT HEART CATHETERIZATION WITH CORONARY ANGIOGRAM;  Surgeon: Burnell Blanks, MD;  Location: [facility] CATH LAB;  Service: Cardiovascular;  Laterality: N/A;   TEE WITHOUT CARDIOVERSION N/A 11/01/2016   Procedure: TRANSESOPHAGEAL ECHOCARDIOGRAM (TEE);  Surgeon: Gaye Pollack, MD;  Location: [place];  Service: Open Heart Surgery;  Laterality: N/A;     Current Meds  Medication Sig   nitroGLYCERIN (NITROSTAT) 0.4 MG SL tablet PLACE 1 TABLET UNDER THE TONGUE EVERY 5 MINUTES AS NEEDED FOR CHEST PAIN.     Allergies:   No known allergies   Social History   Tobacco Use   Smoking status: Never   Smokeless tobacco: Never  Vaping Use   Vaping Use: Never used  Substance Use Topics   Alcohol use: Yes    Comment: rare   Drug use: No     Family Hx: The patient's family history includes CAD (age of onset: 82) in his brother; Heart attack (age of onset: 87) in his mother; Heart attack (age of onset: 70) in his father.  ROS:   Please see the history of present illness.     All other systems reviewed and are negative.   Prior CV studies:   The following studies were reviewed today:    Labs/Other Tests and Data Reviewed:    EKG:     Recent Labs: 05/21/2021: BUN 20; Creatinine, Ser 0.88; Potassium 3.9; Sodium 144   Recent Lipid Panel Lab Results  Component Value Date/Time   CHOL 150 03/03/2019 08:42 AM   TRIG 60 03/03/2019 08:42 AM   HDL 64 03/03/2019 08:42 AM   CHOLHDL 2.3 03/03/2019 08:42 AM   CHOLHDL 2.6 10/26/2016 02:32 AM   LDLCALC 74 03/03/2019 08:42 AM    Wt Readings from Last 3 Encounters:  07/27/21 194 lb 3.2 oz (88.1 kg)  04/27/21 187 lb 3.2 oz (84.9 kg)  04/14/19 182 lb (82.6 kg)     Objective:    Physical  Exam: Blood pressure 118/72, pulse 64, height 6' (1.829 m), weight 194 lb 3.2 oz (88.1 kg), SpO2 97 %.  GEN:  Well nourished, well developed in no acute distress HEENT: Normal NECK: No JVD; No carotid bruits LYMPHATICS: No lymphadenopathy CARDIAC: RRR , no murmurs, rubs,  gallops RESPIRATORY:  Clear to auscultation without rales, wheezing or rhonchi  ABDOMEN: Soft, non-tender, non-distended MUSCULOSKELETAL:  No edema; No deformity  SKIN: Warm and dry NEUROLOGIC:  Alert and oriented x 3   ECG:    ASSESSMENT & PLAN:    Coronary artery disease:   stable , restart ASA  Asked him to avoid Goodies powders    2 hyperlipidemia:   check lipids and ALT today  Bmp was drawn 2 months ago and is stable    2.  Acute on Chronic systolic / diastolc  congestive heart failure:  EF is better on entresto  HR is stable  Will not restart metoprolol ( had had stopped it)  Will not restart aldactone ( he had stopped it )  Seems to tolerate the entresto fairly well.       Medication Adjustments/Labs and Tests Ordered: Current medicines are reviewed at length with the patient today.  Concerns regarding medicines are outlined above.   Tests Ordered: Orders Placed This Encounter  Procedures   ALT   Lipid panel      Medication Changes: No orders of the defined types were placed in this encounter.    Follow Up:  In Person in 6 week(s)  Signed, Mertie Moores, MD  07/27/2021 5:20 PM    [facility]

## 2021-07-28 LAB — LIPID PANEL
Chol/HDL Ratio: 2.5 ratio (ref 0.0–5.0)
Cholesterol, Total: 181 mg/dL (ref 100–199)
HDL: 73 mg/dL (ref >39)
LDL Chol Calc (NIH): 97 mg/dL (ref 0–99)
Triglycerides: 59 mg/dL (ref 0–149)
VLDL Cholesterol Cal: 11 mg/dL (ref 5–40)

## 2021-07-28 LAB — ALT: ALT: 11 IU/L (ref 0–44)

## 2021-07-30 ENCOUNTER — Other Ambulatory Visit: Payer: Self-pay | Admitting: *Deleted

## 2021-07-30 DIAGNOSIS — E782 Mixed hyperlipidemia: Secondary | ICD-10-CM

## 2021-07-30 DIAGNOSIS — I2510 Atherosclerotic heart disease of native coronary artery without angina pectoris: Secondary | ICD-10-CM

## 2021-07-30 MED ORDER — ROSUVASTATIN CALCIUM 20 MG PO TABS: 20.0000 mg | ORAL_TABLET | Freq: Every day | ORAL | 3 refills | Status: AC

## 2021-08-08 ENCOUNTER — Other Ambulatory Visit: Payer: Self-pay | Admitting: Cardiovascular Disease
